# Patient Record
Sex: Female | Born: 1975 | Race: White | Hispanic: No | Marital: Married | Smoking: Former smoker
Health system: Southern US, Community
[De-identification: ages and names within clinical notes are randomized; demographics above are authoritative.]

## PROBLEM LIST (undated history)

## (undated) DIAGNOSIS — T7840XA Allergy, unspecified, initial encounter: Secondary | ICD-10-CM

## (undated) DIAGNOSIS — F410 Panic disorder [episodic paroxysmal anxiety] without agoraphobia: Secondary | ICD-10-CM

## (undated) DIAGNOSIS — Z87898 Personal history of other specified conditions: Secondary | ICD-10-CM

## (undated) DIAGNOSIS — G43909 Migraine, unspecified, not intractable, without status migrainosus: Secondary | ICD-10-CM

## (undated) DIAGNOSIS — N189 Chronic kidney disease, unspecified: Secondary | ICD-10-CM

## (undated) DIAGNOSIS — F32A Depression, unspecified: Secondary | ICD-10-CM

## (undated) DIAGNOSIS — E538 Deficiency of other specified B group vitamins: Secondary | ICD-10-CM

## (undated) DIAGNOSIS — R32 Unspecified urinary incontinence: Secondary | ICD-10-CM

## (undated) DIAGNOSIS — N200 Calculus of kidney: Secondary | ICD-10-CM

## (undated) DIAGNOSIS — F329 Major depressive disorder, single episode, unspecified: Secondary | ICD-10-CM

## (undated) DIAGNOSIS — F419 Anxiety disorder, unspecified: Secondary | ICD-10-CM

## (undated) DIAGNOSIS — G43109 Migraine with aura, not intractable, without status migrainosus: Secondary | ICD-10-CM

## (undated) HISTORY — DX: Deficiency of other specified B group vitamins: E53.8

## (undated) HISTORY — DX: Depression, unspecified: F32.A

## (undated) HISTORY — DX: Calculus of kidney: N20.0

## (undated) HISTORY — DX: Chronic kidney disease, unspecified: N18.9

## (undated) HISTORY — DX: Panic disorder (episodic paroxysmal anxiety): F41.0

## (undated) HISTORY — DX: Major depressive disorder, single episode, unspecified: F32.9

## (undated) HISTORY — DX: Unspecified urinary incontinence: R32

## (undated) HISTORY — DX: Allergy, unspecified, initial encounter: T78.40XA

## (undated) HISTORY — DX: Personal history of other specified conditions: Z87.898

## (undated) HISTORY — DX: Migraine with aura, not intractable, without status migrainosus: G43.109

## (undated) HISTORY — DX: Anxiety disorder, unspecified: F41.9

---
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
## 2008-06-25 ENCOUNTER — Inpatient Hospital Stay (HOSPITAL_COMMUNITY): Admission: AD | Admit: 2008-06-25 | Discharge: 2008-06-28 | Payer: Self-pay | Admitting: Obstetrics and Gynecology

## 2008-06-25 ENCOUNTER — Encounter (HOSPITAL_COMMUNITY): Payer: Self-pay | Admitting: Obstetrics and Gynecology

## 2010-09-20 ENCOUNTER — Encounter: Admission: RE | Admit: 2010-09-20 | Discharge: 2010-09-20 | Payer: Self-pay | Admitting: Family Medicine

## 2011-04-25 NOTE — Op Note (Signed)
Diamond Hodges, Diamond Hodges NO.:  [id number]   MEDICAL RECORD NO.:  [id number]          PATIENT TYPE:  INP   LOCATION:  9103                          FACILITY:  [facility]   PHYSICIAN:  Zelphia Cairo, MD    DATE OF BIRTH:  Oct 06, 1976   DATE OF PROCEDURE:  06/25/2008  DATE OF DISCHARGE:                               OPERATIVE REPORT   PREOPERATIVE DIAGNOSES:  1. Intrauterine pregnancy at 38 weeks.  2. Low placenta.  3. Vaginal bleeding.   POSTOPERATIVE DIAGNOSES:  1. Intrauterine pregnancy at 38 weeks.  2. Low placenta.  3. Vaginal bleeding.   PROCEDURE:  Primary low transverse cesarean delivery.   SURGEON:  Zelphia Cairo, MD   ANESTHESIA:  Spinal.   ESTIMATED BLOOD LOSS:  600 mL.   URINE OUTPUT:  175 mL of clear urine.   COMPLICATIONS:  None.   FINDINGS:  Vigorous female with Apgars of 8 and 9, normal appearing  pelvis.   COMPLICATIONS:  None.   CONDITION:  To recovery room.   PROCEDURE:  The patient was taken to the operating room where spinal  anesthesia was found to be adequate.  She was placed in the supine  position with a left tilt.  Fetal heart tones were reassuring between  150s and 160s.  She was prepped and draped in sterile fashion and a  Foley catheter was inserted.  Pfannenstiel skin incision was made with a  scalpel and carried down to the underlying fascia.  The fascia was  incised in the midline.  This was extended laterally with Mayo scissors.  Kocher clamps were used to grasp the superior portion of the fascia.  This was tented upwards.  The underlying rectus muscles were dissected  off using the Bovie.  Inferior portion of the fascia was tented upwards  and the underlying rectus muscles were dissected off using the Bovie.  Peritoneum was then identified and entered sharply with Metzenbaum  scissors.  This was extended superiorly and inferiorly with good  visualization of the bladder.  The bladder flap was then created and the  bladder blade was reinserted.   Uterine incision was made with a scalpel and this was extended bluntly.  The fetal vertex was brought to the uterine incision.  The mouth and  nose were suctioned and the shoulder and body easily followed.  The cord  was clamped and cut and the infant was taken to the awaiting pediatric  staff.  The placenta was manually removed from the uterus.  The uterus  was cleared of all clots and debris using a dry lap sponge.  Any  remaining membranes were teased out from the uterine cavity using a ring  forceps.  Dry lap sponges then reinserted to clean out any clots and  debris and remaining placental tissue.  The uterine incision was then  closed in a double layer fascia using 0 chromic in a running locked  fashion.  Once the uterine incision was found to be hemostatic, the  uterus was placed back into the pelvic cavity and the pelvis was  copiously irrigated with warm normal saline.  Uterine incision was  reinspected and found to be hemostatic.  Peritoneum was then closed with  0 Monocryl.  The fascia was closed with a looped 0 PDS and skin was  closed with staples.  Sponge lap, needle and instrument counts were  correct x2      Zelphia Cairo, MD  Electronically Signed     GA/MEDQ  D:  06/25/2008  T:  06/26/2008  Job:  [id number]

## 2011-04-28 NOTE — Discharge Summary (Signed)
Diamond Hodges, BLANKENBECKLER NO.:  [id number]   MEDICAL RECORD NO.:  [id number]          PATIENT TYPE:  INP   LOCATION:  9103                          FACILITY:  [facility]   PHYSICIAN:  Freddy Finner, M.D.   DATE OF BIRTH:  1976/03/23   DATE OF ADMISSION:  06/25/2008  DATE OF DISCHARGE:  06/28/2008                               DISCHARGE SUMMARY   ADMITTING DIAGNOSES:  1. Intrauterine pregnancy at 49 weeks' estimated gestational age.  2. History of low-lying placenta.  3. Vaginal bleeding.   DISCHARGE DIAGNOSES:  1. Status post low-transverse cesarean section.  2. Viable female infant.   PROCEDURE:  Primary low-transverse cesarean section.   REASON FOR ADMISSION:  Please see written H&P.   HOSPITAL COURSE:  The patient is a 36 year old gravida 3, para 1-0-1-1  that was admitted to [facility] at 35 weeks' estimated  gestational age for a cesarean delivery.  The patient had been followed  early in her pregnancy for a low-lying placenta that had been resolved  by her last ultrasound.  However, during the cervical exam while here in  the office, the patient did experience an episode of heavy vaginal  bleeding.  No contractions were noted and fetal heart tones were  reactive; however, the patient was now admitted for a cesarean delivery.  The patient was taken to the operating room, where spinal anesthesia was  administered without difficulty.  A low-transverse incision was made  with delivery of a viable female infant weighing 8 pounds 3 ounces with  Apgars of 8 at one minute and 9 at five minutes.  The patient tolerated  the procedure well and was taken to the recovery room in stable  condition.  On postoperative day #1, the patient was without complaint.  Vital signs were stable.  Abdomen soft with good return of bowel  function.  Fundus was firm and nontender.  Abdominal dressing noted to  be clean, dry, and intact.  Foley was draining with adequate  amounts of  urine output.  Laboratory findings revealed a hemoglobin of 9.0,  platelet count of 187,000, WBC of 17.7.  On postoperative day #2, the  patient was without complaint.  Vital signs were stable.  She was  afebrile.  Fundus firm and nontender.  Incision was clean, dry, and  intact.  On postoperative day #3, the patient was without complaint.  Vital signs were stable.  She was afebrile.  Fundus firm and nontender.  Incision was clean, dry, and intact.  Staples removed, and the patient  was later discharged home.   CONDITION ON DISCHARGE:  Stable.   DIET:  Regular as tolerated.   ACTIVITY:  No heavy lifting, no driving x2 weeks, and no vaginal entry.   FOLLOWUP:  The patient to follow up in the office in 1-2 weeks for an  incision check.  She is to call for temperature greater than 100  degrees, persistent nausea, vomiting, heavy vaginal bleeding, and/or  redness or drainage from the incisional site.   DISCHARGE MEDICATIONS:  Tylox #30 one p.o. every 4-6 hours p.r.n.,  Motrin  600 mg every 6 hours, prenatal vitamins one p.o. daily, Colace  one p.o. daily p.r.n.      Julio Sicks, N.P.      Freddy Finner, M.D.  Electronically Signed    CC/MEDQ  D:  07/26/2008  T:  07/27/2008  Job:  [id number]

## 2011-09-08 LAB — CBC
HCT: 26.8 — ABNORMAL LOW
HCT: 35.4 — ABNORMAL LOW
Hemoglobin: 11.9 — ABNORMAL LOW
Hemoglobin: 9 — ABNORMAL LOW
MCHC: 33.7
MCHC: 33.7
MCV: 88.4
MCV: 89.8
Platelets: 187
Platelets: 239
RBC: 2.98 — ABNORMAL LOW
RBC: 4
RDW: 15.7 — ABNORMAL HIGH
RDW: 15.7 — ABNORMAL HIGH
WBC: 15.7 — ABNORMAL HIGH
WBC: 17.7 — ABNORMAL HIGH

## 2011-09-08 LAB — RPR: RPR Ser Ql: NONREACTIVE

## 2011-09-08 LAB — TYPE AND SCREEN
ABO/RH(D): O POS
Antibody Screen: NEGATIVE

## 2011-09-08 LAB — ABO/RH: ABO/RH(D): O POS

## 2015-04-28 ENCOUNTER — Observation Stay (HOSPITAL_COMMUNITY)
Admission: EM | Admit: 2015-04-28 | Discharge: 2015-04-30 | Disposition: A | Discharge status: Home / Self Care | Payer: BC Managed Care – PPO | Attending: Urology | Admitting: Urology

## 2015-04-28 ENCOUNTER — Emergency Department (HOSPITAL_COMMUNITY): Payer: BC Managed Care – PPO

## 2015-04-28 ENCOUNTER — Encounter (HOSPITAL_COMMUNITY): Payer: Self-pay

## 2015-04-28 DIAGNOSIS — R109 Unspecified abdominal pain: Secondary | ICD-10-CM

## 2015-04-28 DIAGNOSIS — Z975 Presence of (intrauterine) contraceptive device: Secondary | ICD-10-CM | POA: Insufficient documentation

## 2015-04-28 DIAGNOSIS — R52 Pain, unspecified: Secondary | ICD-10-CM

## 2015-04-28 DIAGNOSIS — N200 Calculus of kidney: Secondary | ICD-10-CM | POA: Diagnosis present

## 2015-04-28 DIAGNOSIS — N390 Urinary tract infection, site not specified: Secondary | ICD-10-CM | POA: Diagnosis not present

## 2015-04-28 DIAGNOSIS — N201 Calculus of ureter: Principal | ICD-10-CM | POA: Diagnosis present

## 2015-04-28 DIAGNOSIS — R1110 Vomiting, unspecified: Secondary | ICD-10-CM

## 2015-04-28 LAB — CBC WITH DIFFERENTIAL/PLATELET
Basophils Absolute: 0 10*3/uL (ref 0.0–0.1)
Basophils Relative: 0 % (ref 0–1)
Eosinophils Absolute: 0.1 10*3/uL (ref 0.0–0.7)
Eosinophils Relative: 1 % (ref 0–5)
HCT: 37.7 % (ref 36.0–46.0)
Hemoglobin: 12.5 g/dL (ref 12.0–15.0)
Lymphocytes Relative: 10 % — ABNORMAL LOW (ref 12–46)
Lymphs Abs: 1 10*3/uL (ref 0.7–4.0)
MCH: 28.7 pg (ref 26.0–34.0)
MCHC: 33.2 g/dL (ref 30.0–36.0)
MCV: 86.7 fL (ref 78.0–100.0)
Monocytes Absolute: 0.6 10*3/uL (ref 0.1–1.0)
Monocytes Relative: 6 % (ref 3–12)
NEUTROS PCT: 83 % — AB (ref 43–77)
Neutro Abs: 8.7 10*3/uL — ABNORMAL HIGH (ref 1.7–7.7)
Platelets: 197 10*3/uL (ref 150–400)
RBC: 4.35 MIL/uL (ref 3.87–5.11)
RDW: 13.2 % (ref 11.5–15.5)
WBC: 10.4 10*3/uL (ref 4.0–10.5)

## 2015-04-28 LAB — URINALYSIS, ROUTINE W REFLEX MICROSCOPIC
Bilirubin Urine: NEGATIVE
Glucose, UA: NEGATIVE mg/dL
Ketones, ur: >80 mg/dL — AB
Nitrite: NEGATIVE
PH: 5 (ref 5.0–8.0)
Protein, ur: NEGATIVE mg/dL
Specific Gravity, Urine: 1.025 (ref 1.005–1.030)
Urobilinogen, UA: 0.2 mg/dL (ref 0.0–1.0)

## 2015-04-28 LAB — COMPREHENSIVE METABOLIC PANEL
ALT: 16 U/L (ref 14–54)
AST: 23 U/L (ref 15–41)
Albumin: 4.6 g/dL (ref 3.5–5.0)
Alkaline Phosphatase: 52 U/L (ref 38–126)
Anion gap: 11 (ref 5–15)
BUN: 13 mg/dL (ref 6–20)
CO2: 19 mmol/L — ABNORMAL LOW (ref 22–32)
Calcium: 9.2 mg/dL (ref 8.9–10.3)
Chloride: 105 mmol/L (ref 101–111)
Creatinine, Ser: 1.04 mg/dL — ABNORMAL HIGH (ref 0.44–1.00)
GFR calc Af Amer: >60 mL/min (ref >60)
GFR calc non Af Amer: >60 mL/min (ref >60)
Glucose, Bld: 132 mg/dL — ABNORMAL HIGH (ref 65–99)
POTASSIUM: 3.5 mmol/L (ref 3.5–5.1)
Sodium: 135 mmol/L (ref 135–145)
Total Bilirubin: 0.5 mg/dL (ref 0.3–1.2)
Total Protein: 7.4 g/dL (ref 6.5–8.1)

## 2015-04-28 LAB — LIPASE, BLOOD: Lipase: 16 U/L — ABNORMAL LOW (ref 22–51)

## 2015-04-28 LAB — POC URINE PREG, ED: Preg Test, Ur: NEGATIVE

## 2015-04-28 LAB — URINE MICROSCOPIC-ADD ON

## 2015-04-28 MED ORDER — SODIUM CHLORIDE 0.9 % IV BOLUS (SEPSIS)
1000.0000 mL | Freq: Once | INTRAVENOUS | Status: AC
  Administered 2015-04-28: 1000 mL via INTRAVENOUS

## 2015-04-28 MED ORDER — ENOXAPARIN SODIUM 40 MG/0.4ML ~~LOC~~ SOLN
40.0000 mg | SUBCUTANEOUS | Status: DC
  Administered 2015-04-28 – 2015-04-29 (×2): 40 mg via SUBCUTANEOUS
  Filled 2015-04-28 (×2): qty 0.4

## 2015-04-28 MED ORDER — ONDANSETRON HCL 4 MG/2ML IJ SOLN
4.0000 mg | Freq: Once | INTRAMUSCULAR | Status: AC
  Administered 2015-04-28: 4 mg via INTRAVENOUS
  Filled 2015-04-28: qty 2

## 2015-04-28 MED ORDER — KETOROLAC TROMETHAMINE 30 MG/ML IJ SOLN
30.0000 mg | Freq: Once | INTRAMUSCULAR | Status: AC
  Administered 2015-04-28: 30 mg via INTRAVENOUS
  Filled 2015-04-28: qty 1

## 2015-04-28 MED ORDER — KETOROLAC TROMETHAMINE 30 MG/ML IJ SOLN
30.0000 mg | Freq: Four times a day (QID) | INTRAMUSCULAR | Status: DC
  Administered 2015-04-28 – 2015-04-30 (×7): 30 mg via INTRAVENOUS
  Filled 2015-04-28 (×12): qty 1

## 2015-04-28 MED ORDER — MORPHINE SULFATE 4 MG/ML IJ SOLN
4.0000 mg | Freq: Once | INTRAMUSCULAR | Status: AC
  Administered 2015-04-28: 4 mg via INTRAVENOUS
  Filled 2015-04-28: qty 1

## 2015-04-28 MED ORDER — ZOLPIDEM TARTRATE 5 MG PO TABS
5.0000 mg | ORAL_TABLET | Freq: Every evening | ORAL | Status: DC | PRN
  Filled 2015-04-28: qty 1

## 2015-04-28 MED ORDER — HYDROMORPHONE HCL 1 MG/ML IJ SOLN
1.0000 mg | INTRAMUSCULAR | Status: DC | PRN
  Administered 2015-04-28: 1 mg via INTRAVENOUS
  Filled 2015-04-28 (×2): qty 1

## 2015-04-28 MED ORDER — ACETAMINOPHEN 325 MG PO TABS: 650.0000 mg | ORAL_TABLET | ORAL | Status: DC | PRN

## 2015-04-28 MED ORDER — DOCUSATE SODIUM 100 MG PO CAPS
100.0000 mg | ORAL_CAPSULE | Freq: Two times a day (BID) | ORAL | Status: DC
  Administered 2015-04-28 – 2015-04-30 (×4): 100 mg via ORAL
  Filled 2015-04-28 (×6): qty 1

## 2015-04-28 MED ORDER — ONDANSETRON HCL 4 MG/2ML IJ SOLN
4.0000 mg | INTRAMUSCULAR | Status: DC | PRN
  Administered 2015-04-28: 4 mg via INTRAVENOUS
  Filled 2015-04-28: qty 2

## 2015-04-28 MED ORDER — TAMSULOSIN HCL 0.4 MG PO CAPS
0.4000 mg | ORAL_CAPSULE | Freq: Every day | ORAL | Status: DC
  Administered 2015-04-28 – 2015-04-29 (×2): 0.4 mg via ORAL
  Filled 2015-04-28 (×2): qty 1

## 2015-04-28 MED ORDER — MORPHINE SULFATE 4 MG/ML IJ SOLN
8.0000 mg | Freq: Once | INTRAMUSCULAR | Status: AC
  Administered 2015-04-28: 8 mg via INTRAVENOUS
  Filled 2015-04-28: qty 2

## 2015-04-28 MED ORDER — OXYCODONE HCL 5 MG PO TABS
5.0000 mg | ORAL_TABLET | ORAL | Status: DC | PRN
  Administered 2015-04-29 (×2): 5 mg via ORAL
  Filled 2015-04-28 (×2): qty 1

## 2015-04-28 MED ORDER — DEXTROSE-NACL 5-0.45 % IV SOLN
INTRAVENOUS | Status: DC
  Administered 2015-04-28 – 2015-04-30 (×4): via INTRAVENOUS

## 2015-04-28 NOTE — ED Provider Notes (Signed)
CSN: [id number]     Arrival date & time 04/28/15  0710 History   First MD Initiated Contact with Patient 04/28/15 [phone]     Chief Complaint  Patient presents with  . Flank Pain     (Consider location/radiation/quality/duration/timing/severity/associated sxs/prior Treatment) HPI Comments: Diamond Hodges is a 39 y.o. female with a PMHx of pyelonephritis and frequent UTIs, who presents to the ED with complaints of sudden onset left flank pain that began around 3 AM. She states that yesterday she went her gynecologists office because she was having suprapubic pressure and hematuria, and she was diagnosed with a UTI and started on Macrobid. She states that the symptoms have improved today although there still present. Her left flank pain is described as 7/10 constant sharp and aching waxing and waning in severity, nonradiating, worse with standing, mildly improved with a hot bath, and unrelieved with Aleve. She endorses associated nausea and urinary hesitancy describing that she has to "force the urine out" when she uses the restroom. She is unsure if she still having hematuria because she just finished her menses and can't determine whether the bleeding is vaginal blood or urinary blood. Denies any fevers, chills, chest pain, shortness of breath, vomiting, diarrhea, constipation, obstipation, melena, hematochezia, urinary urgency or frequency, rectal pain, vaginal discharge, myalgias, arthralgias, rashes, numbness, tingling, weakness, cauda equina symptoms. She is sexually active with one female partner, her husband, unprotected although she has an IUD in place. She is just finishing her menses.  Patient is a 39 y.o. female presenting with flank pain. The history is provided by the patient. No language interpreter was used.  Flank Pain This is a new problem. The current episode started today. The problem occurs constantly. The problem has been waxing and waning. Associated symptoms include nausea. Pertinent  negatives include no abdominal pain, arthralgias, chest pain, chills, fever, myalgias, numbness, rash, vomiting or weakness. The symptoms are aggravated by standing. She has tried NSAIDs and heat for the symptoms. The treatment provided mild relief.    History reviewed. No pertinent past medical history. Past Surgical History  Procedure Laterality Date  . Cesarean section     No family history on file. History  Substance Use Topics  . Smoking status: Never Smoker   . Smokeless tobacco: Not on file  . Alcohol Use: No   OB History    No data available     Review of Systems  Constitutional: Negative for fever and chills.  Respiratory: Negative for shortness of breath.   Cardiovascular: Negative for chest pain.  Gastrointestinal: Positive for nausea. Negative for vomiting, abdominal pain, diarrhea and constipation.  Genitourinary: Positive for dysuria (pressure, feels like she has to force the urine out) and flank pain. Negative for urgency, frequency, hematuria, vaginal bleeding, vaginal discharge and vaginal pain.  Musculoskeletal: Negative for myalgias and arthralgias.  Skin: Negative for rash.  Allergic/Immunologic: Negative for immunocompromised state.  Neurological: Negative for weakness and numbness.  Psychiatric/Behavioral: Negative for confusion.   10 Systems reviewed and are negative for acute change except as noted in the HPI.    Allergies  Review of patient's allergies indicates no known allergies.  Home Medications   Prior to Admission medications   Not on File   BP 126/84 mmHg  Pulse 78  Temp(Src) 98.4 F (36.9 C) (Oral)  Resp 18  SpO2 100%  LMP 04/26/2015 (Approximate) Physical Exam  Constitutional: She is oriented to person, place, and time. Vital signs are normal. She appears well-developed and well-nourished.  Non-toxic appearance. No distress.  Afebrile, nontoxic, NAD  HENT:  Head: Normocephalic and atraumatic.  Mouth/Throat: Oropharynx is clear  and moist and mucous membranes are normal.  Eyes: Conjunctivae and EOM are normal. Right eye exhibits no discharge. Left eye exhibits no discharge.  Neck: Normal range of motion. Neck supple.  Cardiovascular: Normal rate, regular rhythm, normal heart sounds and intact distal pulses.  Exam reveals no gallop and no friction rub.   No murmur heard. Pulmonary/Chest: Effort normal and breath sounds normal. No respiratory distress. She has no decreased breath sounds. She has no wheezes. She has no rhonchi. She has no rales.  Abdominal: Soft. Normal appearance and bowel sounds are normal. She exhibits no distension. There is no tenderness. There is CVA tenderness (L sided). There is no rigidity, no rebound, no guarding, no tenderness at McBurney's point and negative Murphy's sign.  Soft, NTND, +BS throughout, no r/g/r, neg murphy's, neg mcburney's, with L sided CVA TTP   Musculoskeletal: Normal range of motion.  MAE x4 Strength and sensation grossly intact Distal pulses intact No midline spinal TTP or bony step offs  Neurological: She is alert and oriented to person, place, and time. She has normal strength. No sensory deficit.  Skin: Skin is warm, dry and intact. No rash noted.  Psychiatric: She has a normal mood and affect.  Nursing note and vitals reviewed.   ED Course  Procedures (including critical care time) Labs Review Labs Reviewed  CBC WITH DIFFERENTIAL/PLATELET - Abnormal; Notable for the following:    Neutrophils Relative % 83 (*)    Neutro Abs 8.7 (*)    Lymphocytes Relative 10 (*)    All other components within normal limits  COMPREHENSIVE METABOLIC PANEL - Abnormal; Notable for the following:    CO2 19 (*)    Glucose, Bld 132 (*)    Creatinine, Ser 1.04 (*)    All other components within normal limits  LIPASE, BLOOD - Abnormal; Notable for the following:    Lipase 16 (*)    All other components within normal limits  URINALYSIS, ROUTINE W REFLEX MICROSCOPIC - Abnormal;  Notable for the following:    APPearance CLOUDY (*)    Hgb urine dipstick LARGE (*)    Ketones, ur >80 (*)    Leukocytes, UA SMALL (*)    All other components within normal limits  URINE MICROSCOPIC-ADD ON  POC URINE PREG, ED    Imaging Review Ct Renal Stone Study  04/28/2015   CLINICAL DATA:  Left flank pain  EXAM: CT ABDOMEN AND PELVIS WITHOUT CONTRAST  TECHNIQUE: Multidetector CT imaging of the abdomen and pelvis was performed following the standard protocol without IV contrast.  COMPARISON:  None.  FINDINGS: Lung bases are unremarkable. Sagittal images of the spine are unremarkable.  Unenhanced liver shows no biliary ductal dilatation. No calcified gallstones are noted within gallbladder. No aortic aneurysm.  Unenhanced pancreas, spleen and adrenal glands are unremarkable. There is mild left hydronephrosis and left hydroureter.  Significant left perinephric stranding and small amount of left perinephric fluid. There is left periureteral stranding. In axial image 71 there is a calcified obstructive calculus in distal left ureter measures 4 mm about 4.5 cm from right UVJ. There is IUD in place within retroflexed uterus. No adnexal mass. Urinary bladder is under distended. Bilateral distal ureter is unremarkable.  There is calcified nonobstructive calculus in lower pole of the right kidney measures 6 mm.  Moderate stool and gas is noted in right colon. There is  no pericecal inflammation. Normal retrocecal appendix partially visualized in axial image 70. The terminal ileum is unremarkable.  No small bowel obstruction.  No ascites or free air.  IMPRESSION: 1. There is mild left hydronephrosis and left hydroureter. Significant left perinephric stranding. Mild left periureteral stranding and fluid. Axial image 71 there is 4 mm calcified obstructive calculus in distal left ureter about 4.5 cm from left UVJ. 2. There is nonobstructive calcified calculus in lower pole of the right kidney measures 6 mm. 3. No  pericecal inflammation.  Normal appendix. 4. No small bowel obstruction.   Electronically Signed   By: Natasha MeadLiviu  Pop M.D.   On: 04/28/2015 10:32     EKG Interpretation None      MDM   Final diagnoses:  Left flank pain  Nephrolithiasis  Intractable pain  Intractable vomiting with nausea, vomiting of unspecified type    39 y.o. female here with sudden onset L flank pain, coming in waves, with nausea. On exam, no abdominal tenderness, but L flank CVA TTP. Afebrile. [facility], states she had some suprapubic pressure (denies pain) and hematuria and was started on Macrobid. This seems to be improved today but the flank pain started. Has a hx of pyelo in the past. No vaginal/pelvic symptoms, and no lower abd tenderness, doubt need for pelvic at this time, will proceed with labs and CT stone study. Will give fluids, antiemetics, and pain control.  10:53 AM Upreg neg. U/A with ketones and small leuks, rare squamous, 3-6 WBC, 21-50 RBC, and rare bacteria which is less consistent with her diagnosis of UTI yesterday. CBC w/diff unremarkable, less concerned for pyelo given no leukocytosis. CMP WNL although Cr with slight bump at 1.04. Lipase WNL. CT study showing L hydronephrosis and hydroureter with perinephric stranding and 4mm obstructive stone in distal ureter, as well as nonobstructive R renal stone. Pain somewhat improved but still present after total of 12mg  morphine. Nausea improved with zofran. Will consult urology to discuss.  11:59 AM Dr. Retta Dionesahlstedt of urology returning page, advises to try toradol for improved pain control, if unrelieved then call him back and he will consider intervention, but if able to control pain then can f/up closely with his office. Will attempt this now. Will reassess shortly.   1:10 PM Pt moved and had worsening pain and nausea returned. Called Dr. Retta Dionesahlstedt back and he states he's in the middle of clinic but will come see  pt as soon as he can, will proceed with beginning admission/bed requests and monitor pt. Will order more zofran. Pt states pain is 4/10 now, but doesn't want anything else right now, will continue to monitor.   1:55 PM Peyton NajjarLarry, NP for urology, here to see pt, will admit pt for supportive measures and monitoring. Please see their notes for further documentation of care.   [address]Diamond Emile Yates Centeramprubi-Soms, PA-C 04/28/15 1355  Gerhard Munchobert Lockwood, MD 04/28/15 1537

## 2015-04-28 NOTE — Progress Notes (Addendum)
[facility] [place] providers EPIC updated  Pt voiced interest in educational material on kidneys/uteral stones and dietary measures CM provided pt with information from d/c EPIC section to review

## 2015-04-28 NOTE — ED Notes (Signed)
Pt presents with c/o left flank pain that started around 3 this morning. Pt reports she is feeling spasms in that left flank area as well. Pt went to her gynecologist yesterday and was diagnosed with a bladder infection. Pt reports some blood in her urine but she also reports that she just finished her menstrual cycle. Pt denies any urgency or frequency at this time.

## 2015-04-28 NOTE — H&P (Signed)
H&P  Chief Complaint: Left flank pain  History of Present Illness: Diamond Hodges is a 39 y.o. year old female [facility] with intermittent severe left sided lower back and flank pain. Her symptoms were also associated with urinary frequency/urgency, hesitancy, and weakened stream. She denies gross hematuria or fever. Of note: she was seen by her OBGYN earlier yesterday for pelvic pain and associated urinary frequency/dysuria. She was diagnosed with a UTI and prescribed an antibiotic.   She has never had a urological evaluation and denies history of kidney stones. Currently she is resting comfortably after receiving morphine, torodal and anti-emetics. She does endorse a sensation of bladder pressure which is a new finding. History reviewed. No pertinent past medical history.  Past Surgical History  Procedure Laterality Date  . Cesarean section      Home Medications:   (Not in a hospital admission)  Allergies: No Known Allergies  No family history on file.  Social History:  reports that she has never smoked. She does not have any smokeless tobacco history on file. She reports that she does not drink alcohol or use illicit drugs.  ROS: A complete review of systems was performed.  All systems are negative except for pertinent findings as noted.  Physical Exam:  Vital signs in last 24 hours: Temp:  [98.1 F (36.7 C)-98.4 F (36.9 C)] 98.1 F (36.7 C) (05/18 1220) Pulse Rate:  [78-96] 96 (05/18 1352) Resp:  [17-18] 18 (05/18 1352) BP: (112-126)/(62-84) 112/68 mmHg (05/18 1352) SpO2:  [96 %-100 %] 97 % (05/18 1352) General:  Alert and oriented, No acute distress HEENT: Normocephalic, atraumatic Neck: No JVD or lymphadenopathy Cardiovascular: Regular rate and rhythm Lungs: Clear bilaterally Abdomen: Soft, nontender, nondistended, no abdominal masses Back: No CVA tenderness Extremities: No edema Neurologic: Grossly intact  Laboratory Data:  Results  for orders placed or performed during the hospital encounter of 04/28/15 (from the past 24 hour(s))  CBC with Differential     Status: Abnormal   Collection Time: 04/28/15  8:15 AM  Result Value Ref Range   WBC 10.4 4.0 - 10.5 K/uL   RBC 4.35 3.87 - 5.11 MIL/uL   Hemoglobin 12.5 12.0 - 15.0 g/dL   HCT 16.137.7 09.636.0 - 04.546.0 %   MCV 86.7 78.0 - 100.0 fL   MCH 28.7 26.0 - 34.0 pg   MCHC 33.2 30.0 - 36.0 g/dL   RDW 40.913.2 81.111.5 - 91.415.5 %   Platelets 197 150 - 400 K/uL   Neutrophils Relative % 83 (H) 43 - 77 %   Neutro Abs 8.7 (H) 1.7 - 7.7 K/uL   Lymphocytes Relative 10 (L) 12 - 46 %   Lymphs Abs 1.0 0.7 - 4.0 K/uL   Monocytes Relative 6 3 - 12 %   Monocytes Absolute 0.6 0.1 - 1.0 K/uL   Eosinophils Relative 1 0 - 5 %   Eosinophils Absolute 0.1 0.0 - 0.7 K/uL   Basophils Relative 0 0 - 1 %   Basophils Absolute 0.0 0.0 - 0.1 K/uL  Comprehensive metabolic panel     Status: Abnormal   Collection Time: 04/28/15  8:15 AM  Result Value Ref Range   Sodium 135 135 - 145 mmol/L   Potassium 3.5 3.5 - 5.1 mmol/L   Chloride 105 101 - 111 mmol/L   CO2 19 (L) 22 - 32 mmol/L   Glucose, Bld 132 (H) 65 - 99 mg/dL   BUN 13 6 - 20 mg/dL   Creatinine,  Ser 1.04 (H) 0.44 - 1.00 mg/dL   Calcium 9.2 8.9 - 16.1 mg/dL   Total Protein 7.4 6.5 - 8.1 g/dL   Albumin 4.6 3.5 - 5.0 g/dL   AST 23 15 - 41 U/L   ALT 16 14 - 54 U/L   Alkaline Phosphatase 52 38 - 126 U/L   Total Bilirubin 0.5 0.3 - 1.2 mg/dL   GFR calc non Af Amer >60 >60 mL/min   GFR calc Af Amer >60 >60 mL/min   Anion gap 11 5 - 15  Lipase, blood     Status: Abnormal   Collection Time: 04/28/15  8:15 AM  Result Value Ref Range   Lipase 16 (L) 22 - 51 U/L  Urinalysis, Routine w reflex microscopic     Status: Abnormal   Collection Time: 04/28/15  9:30 AM  Result Value Ref Range   Color, Urine YELLOW YELLOW   APPearance CLOUDY (A) CLEAR   Specific Gravity, Urine 1.025 1.005 - 1.030   pH 5.0 5.0 - 8.0   Glucose, UA NEGATIVE NEGATIVE mg/dL   Hgb  urine dipstick LARGE (A) NEGATIVE   Bilirubin Urine NEGATIVE NEGATIVE   Ketones, ur >80 (A) NEGATIVE mg/dL   Protein, ur NEGATIVE NEGATIVE mg/dL   Urobilinogen, UA 0.2 0.0 - 1.0 mg/dL   Nitrite NEGATIVE NEGATIVE   Leukocytes, UA SMALL (A) NEGATIVE  Urine microscopic-add on     Status: None   Collection Time: 04/28/15  9:30 AM  Result Value Ref Range   Squamous Epithelial / LPF RARE RARE   WBC, UA 3-6 <3 WBC/hpf   RBC / HPF 21-50 <3 RBC/hpf   Bacteria, UA RARE RARE  POC urine preg, ED (not at [facility])     Status: None   Collection Time: 04/28/15  9:50 AM  Result Value Ref Range   Preg Test, Ur NEGATIVE NEGATIVE   No results found for this or any previous visit (from the past 240 hour(s)). Creatinine:  Recent Labs  04/28/15 0815  CREATININE 1.04*    Radiologic Imaging: Ct Renal Stone Study  04/28/2015   CLINICAL DATA:  Left flank pain  EXAM: CT ABDOMEN AND PELVIS WITHOUT CONTRAST  TECHNIQUE: Multidetector CT imaging of the abdomen and pelvis was performed following the standard protocol without IV contrast.  COMPARISON:  None.  FINDINGS: Lung bases are unremarkable. Sagittal images of the spine are unremarkable.  Unenhanced liver shows no biliary ductal dilatation. No calcified gallstones are noted within gallbladder. No aortic aneurysm.  Unenhanced pancreas, spleen and adrenal glands are unremarkable. There is mild left hydronephrosis and left hydroureter.  Significant left perinephric stranding and small amount of left perinephric fluid. There is left periureteral stranding. In axial image 71 there is a calcified obstructive calculus in distal left ureter measures 4 mm about 4.5 cm from right UVJ. There is IUD in place within retroflexed uterus. No adnexal mass. Urinary bladder is under distended. Bilateral distal ureter is unremarkable.  There is calcified nonobstructive calculus in lower pole of the right kidney measures 6 mm.  Moderate stool and gas is noted in right colon. There is no  pericecal inflammation. Normal retrocecal appendix partially visualized in axial image 70. The terminal ileum is unremarkable.  No small bowel obstruction.  No ascites or free air.  IMPRESSION: 1. There is mild left hydronephrosis and left hydroureter. Significant left perinephric stranding. Mild left periureteral stranding and fluid. Axial image 71 there is 4 mm calcified obstructive calculus in distal left ureter about 4.5  cm from left UVJ. 2. There is nonobstructive calcified calculus in lower pole of the right kidney measures 6 mm. 3. No pericecal inflammation.  Normal appendix. 4. No small bowel obstruction.   Electronically Signed   By: Natasha MeadLiviu  Pop M.D.   On: 04/28/2015 10:32    Impression/Assessment:  1. Left distal ureteral lithiasis with hydronephrosis seen on CT imaging 2. Right lower pole lithiasis; non-obstructing  Plan:  1. Admit to Urology service for overnight observation with supportive measures. 2. Begin straining urine 3. If patient remains comfortable with no change in symptoms ie fever, retention, gross hematuria; she will likely be d/c'd tomorrow morning and can follow up in our office at the first of next week.  Chelsea AusDAHLSTEDT, Vishal Sandlin M 04/28/2015, 2:02 PM  Bertram MillardStephen M. Selita Staiger MD

## 2015-04-29 ENCOUNTER — Observation Stay (HOSPITAL_COMMUNITY): Payer: BC Managed Care – PPO | Admitting: Anesthesiology

## 2015-04-29 ENCOUNTER — Encounter (HOSPITAL_COMMUNITY)
Admission: EM | Disposition: A | Discharge status: Home / Self Care | Payer: Self-pay | Source: Home / Self Care | Attending: Emergency Medicine

## 2015-04-29 ENCOUNTER — Encounter (HOSPITAL_COMMUNITY): Payer: Self-pay | Admitting: Anesthesiology

## 2015-04-29 HISTORY — PX: CYSTOSCOPY W/ URETERAL STENT PLACEMENT: SHX1429

## 2015-04-29 LAB — SURGICAL PCR SCREEN
MRSA, PCR: NEGATIVE
STAPHYLOCOCCUS AUREUS: POSITIVE — AB

## 2015-04-29 SURGERY — CYSTOSCOPY, WITH RETROGRADE PYELOGRAM AND URETERAL STENT INSERTION
Anesthesia: General | Site: Ureter | Laterality: Left

## 2015-04-29 MED ORDER — PROPOFOL 10 MG/ML IV BOLUS
INTRAVENOUS | Status: DC | PRN
  Administered 2015-04-29: 200 mg via INTRAVENOUS

## 2015-04-29 MED ORDER — OXYBUTYNIN CHLORIDE 5 MG PO TABS: 5.0000 mg | ORAL_TABLET | Freq: Three times a day (TID) | ORAL | Status: DC | PRN

## 2015-04-29 MED ORDER — LACTATED RINGERS IV SOLN
INTRAVENOUS | Status: DC
  Administered 2015-04-29: 18:00:00 via INTRAVENOUS
  Administered 2015-04-29: 1000 mL via INTRAVENOUS

## 2015-04-29 MED ORDER — CEFAZOLIN SODIUM 1 G IJ SOLR: 2.0000 g | Freq: Once | INTRAMUSCULAR | Status: DC

## 2015-04-29 MED ORDER — FENTANYL CITRATE (PF) 100 MCG/2ML IJ SOLN
INTRAMUSCULAR | Status: DC | PRN
  Administered 2015-04-29: 50 ug via INTRAVENOUS

## 2015-04-29 MED ORDER — LIDOCAINE HCL (CARDIAC) 20 MG/ML IV SOLN
INTRAVENOUS | Status: DC | PRN
  Administered 2015-04-29: 100 mg via INTRAVENOUS

## 2015-04-29 MED ORDER — ONDANSETRON HCL 4 MG/2ML IJ SOLN
INTRAMUSCULAR | Status: DC | PRN
  Administered 2015-04-29: 4 mg via INTRAVENOUS

## 2015-04-29 MED ORDER — CEFAZOLIN SODIUM-DEXTROSE 2-3 GM-% IV SOLR
INTRAVENOUS | Status: AC
  Filled 2015-04-29: qty 50

## 2015-04-29 MED ORDER — SODIUM CHLORIDE 0.9 % IR SOLN
Status: DC | PRN
  Administered 2015-04-29: 1000 mL
  Administered 2015-04-29: 3000 mL

## 2015-04-29 MED ORDER — OXYCODONE HCL 5 MG PO TABS: 5.0000 mg | ORAL_TABLET | ORAL | Status: DC | PRN

## 2015-04-29 MED ORDER — PROMETHAZINE HCL 25 MG/ML IJ SOLN: 6.2500 mg | INTRAMUSCULAR | Status: DC | PRN

## 2015-04-29 MED ORDER — CEPHALEXIN 500 MG PO CAPS: 500.0000 mg | ORAL_CAPSULE | Freq: Two times a day (BID) | ORAL | Status: DC

## 2015-04-29 MED ORDER — MIDAZOLAM HCL 2 MG/2ML IJ SOLN
INTRAMUSCULAR | Status: AC
  Filled 2015-04-29: qty 2

## 2015-04-29 MED ORDER — HYDROMORPHONE HCL 1 MG/ML IJ SOLN
INTRAMUSCULAR | Status: AC
  Filled 2015-04-29: qty 1

## 2015-04-29 MED ORDER — ACETAMINOPHEN 10 MG/ML IV SOLN
1000.0000 mg | Freq: Once | INTRAVENOUS | Status: AC
  Administered 2015-04-29: 1000 mg via INTRAVENOUS
  Filled 2015-04-29: qty 100

## 2015-04-29 MED ORDER — CEFAZOLIN SODIUM-DEXTROSE 2-3 GM-% IV SOLR
INTRAVENOUS | Status: DC | PRN
  Administered 2015-04-29: 2 g via INTRAVENOUS

## 2015-04-29 MED ORDER — FENTANYL CITRATE (PF) 100 MCG/2ML IJ SOLN
INTRAMUSCULAR | Status: AC
  Filled 2015-04-29: qty 2

## 2015-04-29 MED ORDER — CEFAZOLIN SODIUM-DEXTROSE 2-3 GM-% IV SOLR
2.0000 g | INTRAVENOUS | Status: DC
  Filled 2015-04-29: qty 50

## 2015-04-29 MED ORDER — MIDAZOLAM HCL 5 MG/5ML IJ SOLN
INTRAMUSCULAR | Status: DC | PRN
  Administered 2015-04-29: 2 mg via INTRAVENOUS

## 2015-04-29 MED ORDER — DEXAMETHASONE SODIUM PHOSPHATE 10 MG/ML IJ SOLN
INTRAMUSCULAR | Status: DC | PRN
  Administered 2015-04-29: 10 mg via INTRAVENOUS

## 2015-04-29 MED ORDER — HYDROMORPHONE HCL 1 MG/ML IJ SOLN
0.2500 mg | INTRAMUSCULAR | Status: DC | PRN
  Administered 2015-04-29 (×2): 0.5 mg via INTRAVENOUS

## 2015-04-29 MED ORDER — HYDROCODONE-ACETAMINOPHEN 7.5-325 MG PO TABS: 1.0000 | ORAL_TABLET | Freq: Once | ORAL | Status: DC | PRN

## 2015-04-29 MED ORDER — IOHEXOL 300 MG/ML  SOLN
INTRAMUSCULAR | Status: DC | PRN
  Administered 2015-04-29: 10 mL

## 2015-04-29 SURGICAL SUPPLY — 24 items
BAG URO CATCHER STRL LF (DRAPE) ×4 IMPLANT
BASKET ZERO TIP NITINOL 2.4FR (BASKET) ×4 IMPLANT
CATH URET 5FR 28IN CONE TIP (BALLOONS) ×2
CATH URET 5FR 70CM CONE TIP (BALLOONS) ×2 IMPLANT
CLOTH BEACON ORANGE TIMEOUT ST (SAFETY) ×4 IMPLANT
FIBER LASER FLEXIVA 1000 (UROLOGICAL SUPPLIES) IMPLANT
FIBER LASER FLEXIVA 200 (UROLOGICAL SUPPLIES) IMPLANT
FIBER LASER FLEXIVA 365 (UROLOGICAL SUPPLIES) IMPLANT
FIBER LASER FLEXIVA 550 (UROLOGICAL SUPPLIES) IMPLANT
FIBER LASER TRAC TIP (UROLOGICAL SUPPLIES) IMPLANT
GLOVE BIOGEL M 8.0 STRL (GLOVE) ×4 IMPLANT
GOWN STRL REUS W/ TWL XL LVL3 (GOWN DISPOSABLE) ×2 IMPLANT
GOWN STRL REUS W/TWL XL LVL3 (GOWN DISPOSABLE) ×6 IMPLANT
GUIDEWIRE STR DUAL SENSOR (WIRE) ×8 IMPLANT
KIT BALLIN UROMAX 15FX10 (LABEL) ×2 IMPLANT
MANIFOLD NEPTUNE II (INSTRUMENTS) ×4 IMPLANT
PACK CYSTO (CUSTOM PROCEDURE TRAY) ×4 IMPLANT
SET HIGH PRES BAL DIL (LABEL) ×2
SHEATH ACCESS URETERAL 24CM (SHEATH) ×4 IMPLANT
SHEATH ACCESS URETERAL 38CM (SHEATH) ×4 IMPLANT
STENT URET 6FRX24 CONTOUR (STENTS) ×4 IMPLANT
TUBING CONNECTING 10 (TUBING) ×3 IMPLANT
TUBING CONNECTING 10' (TUBING) ×1
WIRE COONS/BENSON .038X145CM (WIRE) ×4 IMPLANT

## 2015-04-29 NOTE — Anesthesia Preprocedure Evaluation (Addendum)
Anesthesia Evaluation  Patient identified by MRN, date of birth, ID band Patient awake    Reviewed: Allergy & Precautions, NPO status , Patient's Chart, lab work & pertinent test results  Airway Mallampati: II  TM Distance: >3 FB Neck ROM: Full    Dental   Pulmonary neg pulmonary ROS,  breath sounds clear to auscultation        Cardiovascular negative cardio ROS  Rhythm:Regular Rate:Normal     Neuro/Psych negative neurological ROS     GI/Hepatic negative GI ROS, Neg liver ROS,   Endo/Other  negative endocrine ROS  Renal/GU Left ureteral stone     Musculoskeletal   Abdominal   Peds  Hematology negative hematology ROS (+)   Anesthesia Other Findings   Reproductive/Obstetrics UPT negative                            Lab Results  Component Value Date   WBC 10.4 04/28/2015   HGB 12.5 04/28/2015   HCT 37.7 04/28/2015   MCV 86.7 04/28/2015   PLT 197 04/28/2015   Lab Results  Component Value Date   CREATININE 1.04* 04/28/2015   BUN 13 04/28/2015   NA 135 04/28/2015   K 3.5 04/28/2015   CL 105 04/28/2015   CO2 19* 04/28/2015    Anesthesia Physical Anesthesia Plan  ASA: II  Anesthesia Plan: General   Post-op Pain Management:    Induction: Intravenous  Airway Management Planned: LMA  Additional Equipment:   Intra-op Plan:   Post-operative Plan:   Informed Consent: I have reviewed the patients History and Physical, chart, labs and discussed the procedure including the risks, benefits and alternatives for the proposed anesthesia with the patient or authorized representative who has indicated his/her understanding and acceptance.   Dental advisory given  Plan Discussed with: CRNA  Anesthesia Plan Comments:         Anesthesia Quick Evaluation

## 2015-04-29 NOTE — Op Note (Signed)
Preoperative diagnosis:left ureteral calculus  Postoperative diagnosis:same   Procedure:cystoscopy, left retrograde ureteropyelogram with interpretive fluoroscopy, left ureteroscopy following dilatation of left distal ureter, basket extraction of left ureteral stone, placement of double-J stent and left ureter-6 [place] by 24 cm contour stent without tether    Surgeon: Lillette Boxer. Zakary Kimura, M.D.   Anesthesia: Gen.   Complications:none  Specimen(s):kidney stone, to the patient  Drain(s):6 [place] by 24 cm contour double-J stent with out string  Indications:39 year old female with intractable pain, nausea and vomiting from a 4 mm left mid/distal ureteral stone with minimal to moderate hydronephrosis. The patient has been hospitalized for proximally 24 hours, and still has nausea with intermittent pain. She is fairly intolerant of oral and IV pain medicines. At this point, because of the nonprogression of the stone as well as the patient's symptoms, she presents for ureteroscopic management. We have also discussed lithotripsy as an alternative  Treatment. Because of the distal location of the stone, ureteroscopy is recommended. Risks and complications have been discussed. She understands these and desires to proceed.    Technique and findings:the patient was properly identified and marked in the holding area. She was taken to the operating room where general endotracheal anesthetic was administered. She was placed in the dorsolithotomy position. Genitalia and perineum were prepped and draped. Proper timeout was performed.  A 23 French panendoscope was advanced into her bladder. Bladder was inspected circumferentially. When this metaplasia noted in the trigone. Otherwise, no abnormalities were noted. Ureteral orifices were normal in configuration and location. The left ureteral orifice was cannulated with a 6 [place] open-ended catheter. Gentle retrograde pyelogram with Omnipaque was performed.  This  revealed a normal distal ureter for proximally 4-5 cm. Just at that point there was a filling defect consistent with the previously mentioned stone. There was proximal hydroureteronephrosis. Mild extravasation of contrast and a forniceal pattern within the left kidney.  At this point, a 0.038 inch sensor-tip guidewire was advanced through the open-ended catheter, into the left renal pelvis using fluoroscopic guidance. The cystoscope and the open-ended catheter were removed, with the wire being left in place. I then dilated the distal ureter with the 12/14 ureteral access sheath, just using the core at first, and then the entire sheath at that point. Would only dilate for proximally 4 cm up the ureter. I then removed the access sheath and passed the ureteroscope first  Through the urethra, then up into the distal ureter. 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 I was unable to pass this gently with the scope. The scope was then backed out. I then passed a 10 cm, 15 French balloon dilator up the ureter. The dilator/balloon was filled with contrast/saline solution to a pressure of 20 atm it was evident that there was a very dense stricture about 4 cm up. Despite me leaving the balloon inflated for about 2 minutes, and up pressure of 20 atm, it still would not dilate. I then deflated the balloon, removed the balloon dilator, and gently passed the ureteroscope past the stricture, using another guidewire through the scope as a guide. I was able to negotiate the stricture. I then encountered the stone about 4 cm proximal to this. I then backed the ureteroscope back out, passed the balloon dilator up again, and once again dilated the entire distal ureter, including the strictured area, with the balloon dilator at a pressure 20 atm for 2 minutes. The balloon dilator was then removed, and the 2 guidewires were left in  place. I then repassed the ureteroscope up to the stone which was easily  grasped with the Nitinol basket and quite easily extracted through the ureter. It was saved for specimen. I then replaced the ureteroscope, and navigated the entire ureter. No further stones were seen. There was significant erythema to the ureter, with some splitting of the ureter at that stricture. I felt it worthwhile to leave a stent indwelling for more than several days, thus I had the string removed from a 24 cm/6 [place] contour stent. This was then passed over the remaining guidewire which had been backloaded through the scope. Fluoroscopic and cystoscopic guidance was used to  Place this. Good proximal and distal curls were seen using fluoroscopy and cystoscopy once a guidewire was removed. The bladder was drained.  The patient was then awakened and taken to the PACU in stable condition. She tolerated the procedure well.

## 2015-04-29 NOTE — Progress Notes (Signed)
  Subjective: Patient reports that nausea and pain persists. She is having no fever.  Objective: Vital signs in last 24 hours: Temp:  [98.5 F (36.9 C)-98.9 F (37.2 C)] 98.9 F (37.2 C) (05/19 0623) Pulse Rate:  [66-98] 74 (05/19 0623) Resp:  [16-18] 16 (05/19 0623) BP: (105-125)/(56-70) 105/56 mmHg (05/19 0623) SpO2:  [97 %-100 %] 100 % (05/19 0623) Weight:  [165 lb (74.844 kg)] 165 lb (74.844 kg) (05/18 1836)  Intake/Output from previous day: 05/18 0701 - 05/19 0700 In: 411.3 [I.V.:411.3] Out: 1575 [Urine:1375; Emesis/NG output:200] Intake/Output this shift:    Physical Exam:  Constitutional: Vital signs reviewed. WD WN in NAD   Eyes: PERRL, No scleral icterus.   Cardiovascular: RRR Pulmonary/Chest: Normal effort   Lab Results:  Recent Labs  04/28/15 0815  HGB 12.5  HCT 37.7   BMET  Recent Labs  04/28/15 0815  NA 135  K 3.5  CL 105  CO2 19*  GLUCOSE 132*  BUN 13  CREATININE 1.04*  CALCIUM 9.2   No results for input(s): LABPT, INR in the last 72 hours. No results for input(s): LABURIN in the last 72 hours. No results found for this or any previous visit.  Studies/Results: Ct Renal Stone Study  04/28/2015   CLINICAL DATA:  Left flank pain  EXAM: CT ABDOMEN AND PELVIS WITHOUT CONTRAST  TECHNIQUE: Multidetector CT imaging of the abdomen and pelvis was performed following the standard protocol without IV contrast.  COMPARISON:  None.  FINDINGS: Lung bases are unremarkable. Sagittal images of the spine are unremarkable.  Unenhanced liver shows no biliary ductal dilatation. No calcified gallstones are noted within gallbladder. No aortic aneurysm.  Unenhanced pancreas, spleen and adrenal glands are unremarkable. There is mild left hydronephrosis and left hydroureter.  Significant left perinephric stranding and small amount of left perinephric fluid. There is left periureteral stranding. In axial image 71 there is a calcified obstructive calculus in distal left  ureter measures 4 mm about 4.5 cm from right UVJ. There is IUD in place within retroflexed uterus. No adnexal mass. Urinary bladder is under distended. Bilateral distal ureter is unremarkable.  There is calcified nonobstructive calculus in lower pole of the right kidney measures 6 mm.  Moderate stool and gas is noted in right colon. There is no pericecal inflammation. Normal retrocecal appendix partially visualized in axial image 70. The terminal ileum is unremarkable.  No small bowel obstruction.  No ascites or free air.  IMPRESSION: 1. There is mild left hydronephrosis and left hydroureter. Significant left perinephric stranding. Mild left periureteral stranding and fluid. Axial image 71 there is 4 mm calcified obstructive calculus in distal left ureter about 4.5 cm from left UVJ. 2. There is nonobstructive calcified calculus in lower pole of the right kidney measures 6 mm. 3. No pericecal inflammation.  Normal appendix. 4. No small bowel obstruction.   Electronically Signed   By: Natasha MeadLiviu  Pop M.D.   On: 04/28/2015 10:32    Assessment/Plan:    persistent left distal ureteral stone with associated pain and nausea  i discussed further management with the patient and her husband, who was in the room today. Because of her persistent pain, and need formedicinefor this, with associated nausea, I suggested intervention-we discussed ureteroscopy versus lithotripsy.I feel thatwith a distal ureteral stone, ureteroscopy as the way to proceed. Risks and benefits of the procedure were discussed at length. We will proceed in the afternoon.     Chelsea AusDAHLSTEDT, Althia Egolf M 04/29/2015, 12:23 PM

## 2015-04-29 NOTE — Progress Notes (Signed)
Report received from K. Lutterloh, RN. No change from the initial pm assessment. Will continue to monitor and follow the POC. 

## 2015-04-29 NOTE — Transfer of Care (Signed)
Immediate Anesthesia Transfer of Care Note  Patient: Madiline Hinsch  Procedure(s) Performed: Procedure(s): CYSTOSCOPY WITH RETROGRADE PYELOGRAM/LEFT URETEROSCOPY/STONE BASKETRY/ LEFT URETERAL STENT PLACEMENT (Left)  Patient Location: PACU  Anesthesia Type:General  Level of Consciousness: awake, alert , oriented and patient cooperative  Airway & Oxygen Therapy: Patient Spontanous Breathing and Patient connected to face mask oxygen  Post-op Assessment: Report given to RN, Post -op Vital signs reviewed and stable and Patient moving all extremities X 4  Post vital signs: stable  Last Vitals:  Filed Vitals:   04/29/15 1538  BP: 113/58  Pulse: 83  Temp: 37.1 C  Resp: 18    Complications: No apparent anesthesia complications

## 2015-04-29 NOTE — Anesthesia Procedure Notes (Signed)
Procedure Name: LMA Insertion Date/Time: 04/29/2015 4:53 PM Performed by: Anastasio ChampionEVANS, Aiyla Baucom E Pre-anesthesia Checklist: Patient identified, Emergency Drugs available, Suction available, Patient being monitored and Timeout performed Patient Re-evaluated:Patient Re-evaluated prior to inductionOxygen Delivery Method: Circle system utilized Preoxygenation: Pre-oxygenation with 100% oxygen Intubation Type: IV induction Ventilation: Mask ventilation without difficulty LMA: LMA inserted LMA Size: 4.0 Laryngoscope size: proseal with sump to stomach after LMA placed. Tube type: Oral Number of attempts: 1 Dental Injury: Teeth and Oropharynx as per pre-operative assessment

## 2015-04-29 NOTE — Care Management Note (Signed)
Case Management Note  Patient Details  Name: Diamond Hodges MRN: [id number] Date of Birth: 10/05/1976  Subjective/Objection: 39 yo F admitted with Ureteral Calculus. Pt is from home.                   Action/Plan:Anticipate d/c to home with no needs. Continue to follow.    Expected Discharge Date:                 Expected Discharge Plan:  Home/Self Care  In-House Referral:     Discharge planning Services  CM Consult  Post Acute Care Choice:    Choice offered to:     DME Arranged:    DME Agency:     HH Arranged:    HH Agency:     Status of Service:  In process, will continue to follow  Medicare Important Message Given:    Date Medicare IM Given:    Medicare IM give by:    Date Additional Medicare IM Given:    Additional Medicare Important Message give by:     If discussed at Long Length of Stay Meetings, dates discussed:    Additional Comments:  Lanier ClamMahabir, Stylianos Stradling, RN 04/29/2015, 2:44 PM

## 2015-04-30 ENCOUNTER — Encounter (HOSPITAL_COMMUNITY): Payer: Self-pay | Admitting: Urology

## 2015-04-30 MED ORDER — ONDANSETRON HCL 4 MG PO TABS: 4.0000 mg | ORAL_TABLET | Freq: Three times a day (TID) | ORAL | Status: DC | PRN

## 2015-04-30 MED ORDER — OXYBUTYNIN CHLORIDE 5 MG PO TABS: 5.0000 mg | ORAL_TABLET | Freq: Three times a day (TID) | ORAL | Status: DC | PRN

## 2015-04-30 NOTE — Anesthesia Postprocedure Evaluation (Signed)
  Anesthesia Post-op Note  Patient: Diamond Hodges  Procedure(s) Performed: Procedure(s): CYSTOSCOPY WITH RETROGRADE PYELOGRAM/LEFT URETEROSCOPY/STONE BASKETRY/ LEFT URETERAL STENT PLACEMENT (Left)  Patient Location: PACU  Anesthesia Type:General  Level of Consciousness: awake and alert   Airway and Oxygen Therapy: Patient Spontanous Breathing  Post-op Pain: mild  Post-op Assessment: Post-op Vital signs reviewed  Post-op Vital Signs: Reviewed  Last Vitals:  Filed Vitals:   04/30/15 1140  BP:   Pulse:   Temp: 36.8 C  Resp:     Complications: No apparent anesthesia complications

## 2015-04-30 NOTE — Care Management Note (Signed)
Case Management Note  Patient Details  Name: Donald Proseimee Bloodworth MRN: [id number] Date of Birth: 08/24/1976  Subjective/Objective                 Action/Plan:Discharged to home with no d/c needs or orders.    Expected Discharge Date:                Expected Discharge Plan:  Home/Self Care  In-House Referral:     Discharge planning Services  CM Consult  Post Acute Care Choice:    Choice offered to:     DME Arranged:    DME Agency:     HH Arranged:    HH Agency:     Status of Service:  Completed, signed off  Medicare Important Message Given:    Date Medicare IM Given:    Medicare IM give by:    Date Additional Medicare IM Given:    Additional Medicare Important Message give by:     If discussed at Long Length of Stay Meetings, dates discussed:    Additional Comments:  Lanier ClamMahabir, Kalyan Barabas, RN 04/30/2015, 9:04 AM

## 2015-04-30 NOTE — Discharge Instructions (Signed)
POSTOPERATIVE CARE AFTER URETEROSCOPY ° °Stent management ° °*Stents are often left in after ureteroscopy and stone treatment. If left in, they often cause urinary frequency, urgency, occasional blood in the urine, as well as flank discomfort with urination. These are all expected issues, and should resolve after the stent is removed. ° ° °Diet ° °Once you have adequately recovered from anesthesia, you may gradually advance your diet, as tolerated, to your regular diet. ° °Activities ° °You may gradually increase your activities to your normal unrestricted level the day following your procedure. ° °Medications ° °You should resume all preoperative medications. If you are on aspirin-like compounds, you should not resume these until the blood clears from your urine. If given an antibiotic by the surgeon, take these until they are completed. You may also be given, if you have a stent, medications to decrease the urinary frequency and urgency. ° °Pain ° °After ureteroscopy, there may be some pain on the side of the scope. Take your pain medicine for this. Usually, this pain resolves within a day or 2. ° °Fever ° °Please report any fever over 100° to the doctor. ° °

## 2015-04-30 NOTE — Discharge Summary (Signed)
Patient ID: Diamond Hodges MRN: [id number] DOB/AGE: 38/02/1976 38 y.o.  Admit date: 04/28/2015 Discharge date: 04/30/2015  Primary Care Physician:  PROVIDER NOT IN SYSTEM  Discharge Diagnoses:   Present on Admission:  . Ureteral calculus, left  Consults:  None   Discharge Medications:   Medication List    TAKE these medications        cephALEXin 500 MG capsule  Commonly known as:  KEFLEX  Take 1 capsule (500 mg total) by mouth 2 (two) times daily.     naproxen sodium 220 MG tablet  Commonly known as:  ANAPROX  Take 220-440 mg by mouth every 12 (twelve) hours as needed (pain).     nitrofurantoin (macrocrystal-monohydrate) 100 MG capsule  Commonly known as:  MACROBID  Take 100 mg by mouth 2 (two) times daily.     oxybutynin 5 MG tablet  Commonly known as:  DITROPAN  Take 1 tablet (5 mg total) by mouth every 8 (eight) hours as needed for bladder spasms.     oxybutynin 5 MG tablet  Commonly known as:  DITROPAN  Take 1 tablet (5 mg total) by mouth every 8 (eight) hours as needed for bladder spasms.     oxyCODONE 5 MG immediate release tablet  Commonly known as:  Oxy IR/ROXICODONE  Take 1 tablet (5 mg total) by mouth every 4 (four) hours as needed for moderate pain.         Significant Diagnostic Studies:  Ct Renal Stone Study  04/28/2015   CLINICAL DATA:  Left flank pain  EXAM: CT ABDOMEN AND PELVIS WITHOUT CONTRAST  TECHNIQUE: Multidetector CT imaging of the abdomen and pelvis was performed following the standard protocol without IV contrast.  COMPARISON:  None.  FINDINGS: Lung bases are unremarkable. Sagittal images of the spine are unremarkable.  Unenhanced liver shows no biliary ductal dilatation. No calcified gallstones are noted within gallbladder. No aortic aneurysm.  Unenhanced pancreas, spleen and adrenal glands are unremarkable. There is mild left hydronephrosis and left hydroureter.  Significant left perinephric stranding and small amount of left perinephric  fluid. There is left periureteral stranding. In axial image 71 there is a calcified obstructive calculus in distal left ureter measures 4 mm about 4.5 cm from right UVJ. There is IUD in place within retroflexed uterus. No adnexal mass. Urinary bladder is under distended. Bilateral distal ureter is unremarkable.  There is calcified nonobstructive calculus in lower pole of the right kidney measures 6 mm.  Moderate stool and gas is noted in right colon. There is no pericecal inflammation. Normal retrocecal appendix partially visualized in axial image 70. The terminal ileum is unremarkable.  No small bowel obstruction.  No ascites or free air.  IMPRESSION: 1. There is mild left hydronephrosis and left hydroureter. Significant left perinephric stranding. Mild left periureteral stranding and fluid. Axial image 71 there is 4 mm calcified obstructive calculus in distal left ureter about 4.5 cm from left UVJ. 2. There is nonobstructive calcified calculus in lower pole of the right kidney measures 6 mm. 3. No pericecal inflammation.  Normal appendix. 4. No small bowel obstruction.   Electronically Signed   By: Natasha MeadLiviu  Pop M.D.   On: 04/28/2015 10:32    Brief H and P: For complete details please refer to admission H and P, but in brief the patient was admitted for management of an obstructing, significantly symptomatic left distal ureteral stone with associated nausea and vomiting.  Hospital Course:  The patient was admitted for pain management. She persisted  with pain, nausea and vomiting. On the second hospital day it was decided that we would proceed with stone management. She underwent ureteroscopy which she tolerated well. She was discharged on hospital day #3.  Day of Discharge BP 103/52 mmHg  Pulse 65  Temp(Src) 97.7 F (36.5 C) (Oral)  Resp 16  Ht 5\' 7"  (1.702 m)  Wt 165 lb (74.844 kg)  BMI 25.84 kg/m2  SpO2 99%  LMP 04/23/2015  Results for orders placed or performed during the hospital encounter of  04/28/15 (from the past 24 hour(s))  Surgical pcr screen     Status: Abnormal   Collection Time: 04/29/15  2:04 PM  Result Value Ref Range   MRSA, PCR NEGATIVE NEGATIVE   Staphylococcus aureus POSITIVE (A) NEGATIVE    Physical Exam: General: Alert and awake oriented x3 not in any acute distress. HEENT: anicteric sclera, pupils reactive to light and accommodation CVS: S1-S2 clear no murmur rubs or gallops Chest: clear to auscultation bilaterally, no wheezing rales or rhonchi Abdomen: soft nontender, nondistended, normal bowel sounds, no organomegaly Extremities: no cyanosis, clubbing or edema noted bilaterally Neuro: Cranial nerves II-XII intact, no focal neurological deficits  Disposition:  Home  Diet:  No restrictions no restrictions  Activity:  No restrictions   Disposition and Follow-up:     Discharge Instructions    Discharge patient    Complete by:  As directed             Instructions given to patient  TESTS THAT NEED FOLLOW-UP  None  DISCHARGE FOLLOW-UP Follow-up Information    Follow up with Chelsea AusAHLSTEDT, Davinder Haff M, MD.   Specialty:  Urology   Why:  we will call for followup appt   Contact information:   [address] [place] [address] [phone]       Time spent on Discharge:  10 minutes  Signed: Chelsea AusDAHLSTEDT, Verdia Bolt M 04/30/2015, 7:31 AM

## 2016-02-14 ENCOUNTER — Ambulatory Visit (INDEPENDENT_AMBULATORY_CARE_PROVIDER_SITE_OTHER): Payer: BC Managed Care – PPO | Admitting: Family Medicine

## 2016-02-14 ENCOUNTER — Encounter: Payer: Self-pay | Admitting: Family Medicine

## 2016-02-14 VITALS — BP 124/80 | HR 64 | Ht 67.5 in | Wt 180.6 lb

## 2016-02-14 DIAGNOSIS — F329 Major depressive disorder, single episode, unspecified: Secondary | ICD-10-CM | POA: Diagnosis not present

## 2016-02-14 DIAGNOSIS — Z8659 Personal history of other mental and behavioral disorders: Secondary | ICD-10-CM | POA: Diagnosis not present

## 2016-02-14 DIAGNOSIS — F32A Depression, unspecified: Secondary | ICD-10-CM

## 2016-02-14 DIAGNOSIS — F411 Generalized anxiety disorder: Secondary | ICD-10-CM | POA: Diagnosis not present

## 2016-02-14 DIAGNOSIS — Z0000 Encounter for general adult medical examination without abnormal findings: Secondary | ICD-10-CM | POA: Diagnosis not present

## 2016-02-14 LAB — CBC WITH DIFFERENTIAL/PLATELET
BASOS ABS: 0.1 10*3/uL (ref 0.0–0.1)
Basophils Relative: 1 % (ref 0–1)
EOS PCT: 2 % (ref 0–5)
Eosinophils Absolute: 0.2 10*3/uL (ref 0.0–0.7)
HEMATOCRIT: 37.6 % (ref 36.0–46.0)
Hemoglobin: 12.9 g/dL (ref 12.0–15.0)
LYMPHS ABS: 2 10*3/uL (ref 0.7–4.0)
LYMPHS PCT: 21 % (ref 12–46)
MCH: 29.1 pg (ref 26.0–34.0)
MCHC: 34.3 g/dL (ref 30.0–36.0)
MCV: 84.7 fL (ref 78.0–100.0)
MPV: 10.4 fL (ref 8.6–12.4)
Monocytes Absolute: 0.6 10*3/uL (ref 0.1–1.0)
Monocytes Relative: 6 % (ref 3–12)
Neutro Abs: 6.7 10*3/uL (ref 1.7–7.7)
Neutrophils Relative %: 70 % (ref 43–77)
Platelets: 246 10*3/uL (ref 150–400)
RBC: 4.44 MIL/uL (ref 3.87–5.11)
RDW: 13.7 % (ref 11.5–15.5)
WBC: 9.6 10*3/uL (ref 4.0–10.5)

## 2016-02-14 LAB — POCT URINALYSIS DIPSTICK
BILIRUBIN UA: NEGATIVE
GLUCOSE UA: NEGATIVE
Ketones, UA: NEGATIVE
Leukocytes, UA: NEGATIVE
Nitrite, UA: NEGATIVE
PH UA: 6
Protein, UA: NEGATIVE
SPEC GRAV UA: 1.025
Urobilinogen, UA: NEGATIVE

## 2016-02-14 NOTE — Progress Notes (Signed)
Subjective:    Patient ID: Diamond Hodges, female    DOB: 10/30/1976, 40 y.o.   MRN: [id number]  HPI Chief Complaint  Patient presents with  . new pt    new pt cpe. no other concerns. wants cholesterol tested    She is new to the practice and here to establish primary care. She is also here for a complete physical exam. Her previous PCP was [facility]  Other providers: Ob/GYN- [facility] Charlotta Newtonzan. States she saw her in January of this year.  Her last physical was about 6 years and similar for blood work. Denies fever, chills, unexplained weight change, fatigue.   She is thinking she needs an antidepressant, states she feels mildly-moderate depressed. This has been ongoing for about a year. She has a Veterinary surgeoncounselor, Josephina GipElizabeth Atkinson, seeing her for years.  States she can be obsessive with her thinking. States she was prescribed Prozac in past for same but did not start it. She states she is more receptive to the idea of medication now.  Does not have desire to do her usual activities. She has dealt with anxiety and depression in past. Also reports having panic attacks in past. No medications for this. Has been able to talk herself out of it.  Does get joy from activites. Performs her daily activities and goes to work daily.  Reports obsessive thoughts about her daughter and her safety, does not think she is compulsive.  Denies SI or HI. Has thought in past that she might not "mind being dead" but no thoughts of this recently and no plan to harm herself.   LMP: regular every 28 days. paraguard IUD Last pap smear: 12/2015- normal per patient.  Eye exam: never.  Mammogram: first one at 89 for baseline- mother breast cancer at 4747 Colonoscopy: never   Immunizations: flu- never, Tdap- 4 years ago.   Lives with husband, 2 kids 16, 7.  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  Denies smoking, alcohol use, or drug use Family history significant for mental illness - father  with addiction and depression, brothers with addiction and substance abuse.   Wears seatbelt, sunscreen. No guns in home. No domestic violence.   Exercise: no particular pattern, "know what to do but just not doing it".   Diet: good variety but not "healthy per se".   Reviewed allergies, medications, past medical, surgical, family and social.   Review of Systems Review of Systems Constitutional: -fever, -chills, -sweats, -unexpected weight change,-fatigue ENT: -runny nose, -ear pain, -sore throat Cardiology:  -chest pain, -palpitations, -edema Respiratory: -cough, -shortness of breath, -wheezing Gastroenterology: -abdominal pain, -nausea, -vomiting, -diarrhea, -constipation  Hematology: -bleeding or bruising problems Musculoskeletal: -arthralgias, -myalgias, -joint swelling, -back pain, +intermittent neck pain. Ophthalmology: -vision changes Urology: -dysuria, -difficulty urinating, -hematuria, -urinary frequency, -urgency Neurology: -headache, -weakness, -tingling, -numbness        Objective:   Physical Exam BP 124/80 mmHg  Pulse 64  Ht 5' 7.5" (1.715 m)  Wt 180 lb 9.6 oz (81.92 kg)  BMI 27.85 kg/m2  General Appearance:    Alert, cooperative, no distress, appears stated age  Head:    Normocephalic, without obvious abnormality, atraumatic  Eyes:    PERRL, conjunctiva/corneas clear, EOM's intact, fundi    benign  Ears:    Normal TM's and external ear canals  Nose:   Nares normal, mucosa normal, no drainage or sinus   tenderness  Throat:   Lips, mucosa, and tongue normal; teeth and gums  normal  Neck:   Supple, no lymphadenopathy;  thyroid:  no   enlargement/tenderness/nodules; no carotid   bruit or JVD  Back:    Spine nontender, no curvature, ROM normal, no CVA     tenderness  Lungs:     Clear to auscultation bilaterally without wheezes, rales or     ronchi; respirations unlabored  Chest Wall:    No tenderness or deformity   Heart:    Regular rate and rhythm, S1 and S2  normal, no murmur, rub   or gallop  Breast Exam:    Not performed, GYN 12/2015. No axillary lymphadenopathy  Abdomen:     Soft, non-tender, nondistended, normoactive bowel sounds,    no masses, no hepatosplenomegaly  Genitalia:    Not done, had GYN appt 12/2015  Rectal:    Not performed due to age<40 and no related complaints  Extremities:   No clubbing, cyanosis or edema  Pulses:   2+ and symmetric all extremities  Skin:   Skin color, texture, turgor normal, no rashes or lesions  Lymph nodes:   Cervical, supraclavicular, and axillary nodes normal  Neurologic:   CNII-XII intact, normal strength, sensation and gait; reflexes 2+ and symmetric throughout          Psych:   Normal mood, affect, hygiene and grooming.    Urinalysis dipstick: trace of blood, negative otherwise. asymptomatic     Assessment & Plan:  Routine general medical examination at a health care facility - Plan: POCT urinalysis dipstick, CBC with Differential/Platelet, Comprehensive metabolic panel, Lipid panel, TSH  History of panic attacks - Plan: TSH  Depression - Plan: TSH  Generalized anxiety disorder - Plan: TSH   Will follow up on anxiety and depression with possible pharmacological therapy after lab results are back. Recommend that she continue seeing her therapist. She denies SI or HI.  Discussed healthy diet and getting at least 150 minutes of physical activity per week.  Discussed safety and health promotion.  Refuses flu shot, Tdap current per patient. No records available.  Will send for mammogram at age 85, mother with early diagnosis breast cancer.  Will follow up pending labs. She will have medical records sent.

## 2016-02-14 NOTE — Patient Instructions (Signed)

## 2016-02-15 ENCOUNTER — Other Ambulatory Visit: Payer: Self-pay | Admitting: Family Medicine

## 2016-02-15 DIAGNOSIS — F428 Other obsessive-compulsive disorder: Secondary | ICD-10-CM | POA: Insufficient documentation

## 2016-02-15 DIAGNOSIS — F329 Major depressive disorder, single episode, unspecified: Secondary | ICD-10-CM

## 2016-02-15 DIAGNOSIS — Z8659 Personal history of other mental and behavioral disorders: Secondary | ICD-10-CM | POA: Insufficient documentation

## 2016-02-15 DIAGNOSIS — F32A Depression, unspecified: Secondary | ICD-10-CM

## 2016-02-15 DIAGNOSIS — F411 Generalized anxiety disorder: Secondary | ICD-10-CM | POA: Insufficient documentation

## 2016-02-15 LAB — COMPREHENSIVE METABOLIC PANEL
ALBUMIN: 4.4 g/dL (ref 3.6–5.1)
ALK PHOS: 62 U/L (ref 33–115)
ALT: 17 U/L (ref 6–29)
AST: 20 U/L (ref 10–30)
BILIRUBIN TOTAL: 0.4 mg/dL (ref 0.2–1.2)
BUN: 9 mg/dL (ref 7–25)
CALCIUM: 9.5 mg/dL (ref 8.6–10.2)
CO2: 26 mmol/L (ref 20–31)
Chloride: 103 mmol/L (ref 98–110)
Creat: 0.7 mg/dL (ref 0.50–1.10)
Glucose, Bld: 93 mg/dL (ref 65–99)
POTASSIUM: 4.6 mmol/L (ref 3.5–5.3)
Sodium: 139 mmol/L (ref 135–146)
TOTAL PROTEIN: 6.8 g/dL (ref 6.1–8.1)

## 2016-02-15 LAB — LIPID PANEL
CHOL/HDL RATIO: 3.4 ratio (ref <=5.0)
CHOLESTEROL: 164 mg/dL (ref 125–200)
HDL: 48 mg/dL (ref >=46)
LDL Cholesterol: 100 mg/dL (ref <130)
TRIGLYCERIDES: 81 mg/dL (ref <150)
VLDL: 16 mg/dL (ref <30)

## 2016-02-15 LAB — TSH: TSH: 1.43 m[IU]/L

## 2016-02-15 MED ORDER — CITALOPRAM HYDROBROMIDE 20 MG PO TABS: ORAL_TABLET | ORAL | Status: DC

## 2016-02-15 NOTE — Progress Notes (Signed)
Patient would like to start medication for combination anxiety, depression, panic and obsessive thoughts. She has not taken medication in past for this. She is seeing a counselor and I recommend she continue therapy. Will prescribe citalopram. Discussed possible side effects at appointment and was awaiting lab results. We will proceed with pharmacological therapy and follow up in 2 weeks.

## 2016-02-16 NOTE — Progress Notes (Signed)
Pt was notified of med recommendations

## 2016-02-16 NOTE — Progress Notes (Signed)
Left message for pt to call me back 

## 2016-02-22 ENCOUNTER — Telehealth: Payer: Self-pay | Admitting: Internal Medicine

## 2016-02-22 NOTE — Telephone Encounter (Signed)
Pt called and left a message stating that she took med wed or Thursday 1/2 tablet for 3-4 days but woke up Sunday in the middle of night with heart racing and tingling and didn't feel right, so she googled side effects and others have commented with side effects like that so it scared her, so she has not taken it anymore and wanted to see what you recommend

## 2016-02-22 NOTE — Telephone Encounter (Signed)
I am ok with her working with her therapist and not taking medication but if she would like to try something different she can let me know.

## 2016-02-22 NOTE — Telephone Encounter (Signed)
Called and left a detailed message for pt about vickie's recomendations

## 2016-07-04 IMAGING — CT CT RENAL STONE PROTOCOL
2 of 3 series · 16 of 32 positions shown, 18 images · non-contrast
Comparison: None.

CLINICAL DATA: Left flank pain

EXAM:
CT ABDOMEN AND PELVIS WITHOUT CONTRAST
TECHNIQUE: Multidetector CT imaging of the abdomen and pelvis was performed
following the standard protocol without IV contrast.

[Series 3: coronal · coronal · 0.84mm/px · 3 of 83 slices shown]
[im 28/83  soft-tissue]
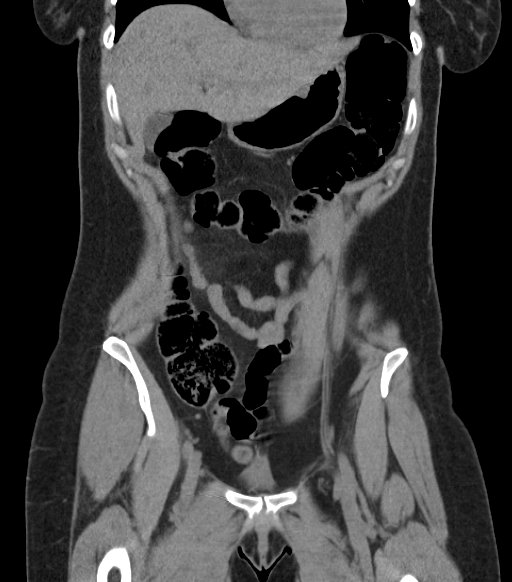
[im 37/83  soft-tissue]
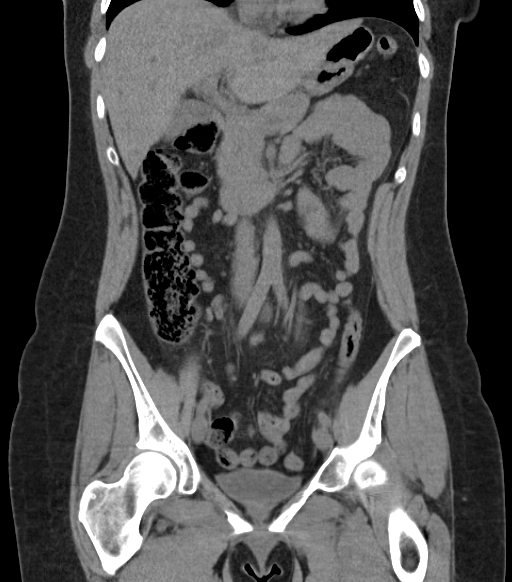
[im 46/83  soft-tissue]
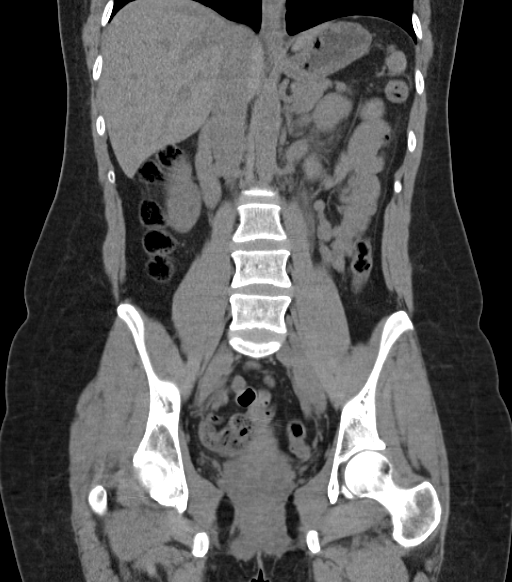

[Series 6: lung · axial · 0.81mm/px · z∈[+1892,+1966]mm · 13 of 18 slices shown, 15 images]
[im 2/18  soft-tissue]
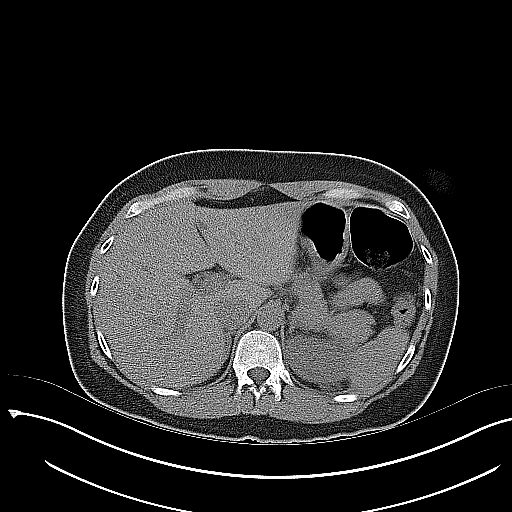
[im 2/18  bone]
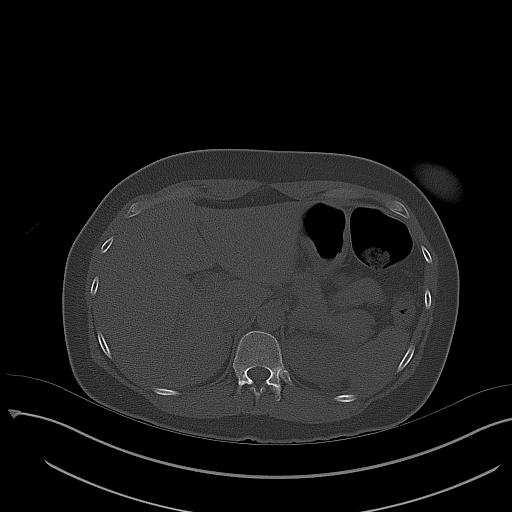
[im 3/18  soft-tissue]
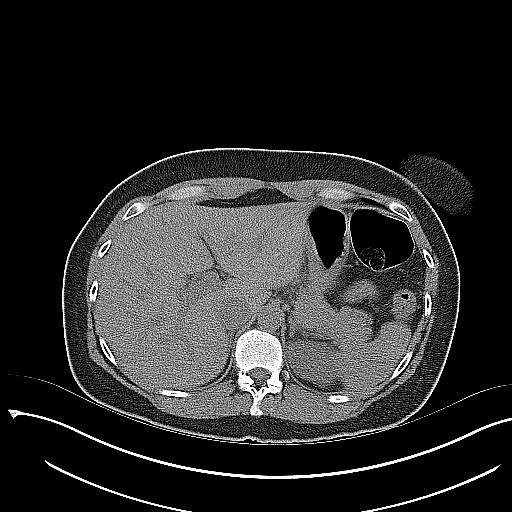
[im 5/18  soft-tissue]
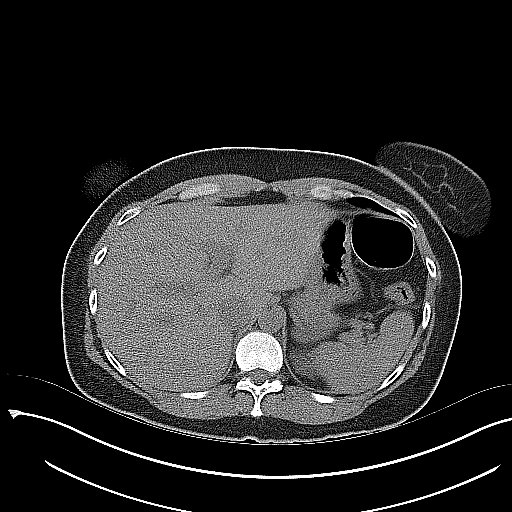
[im 6/18  soft-tissue]
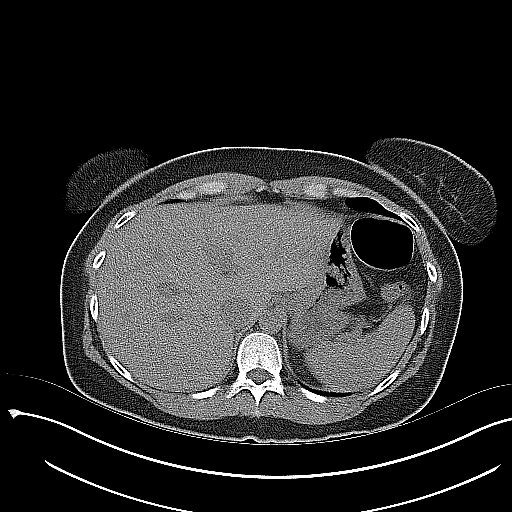
[im 7/18  soft-tissue]
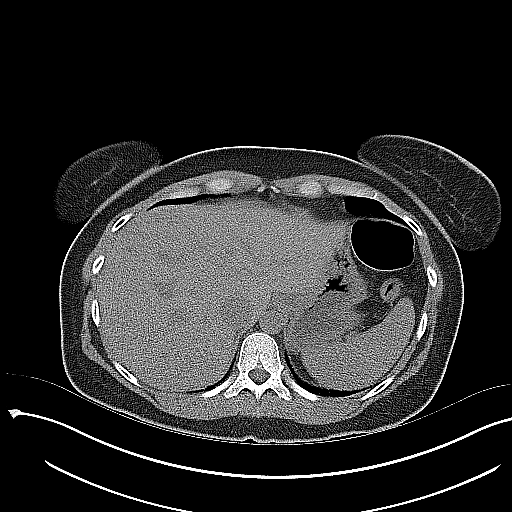
[im 8/18  soft-tissue]
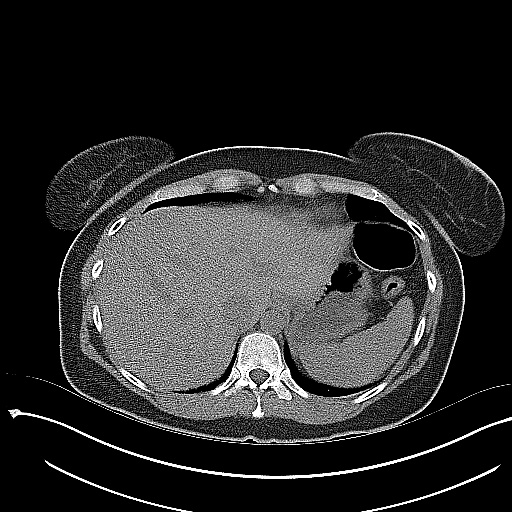
[im 10/18  soft-tissue]
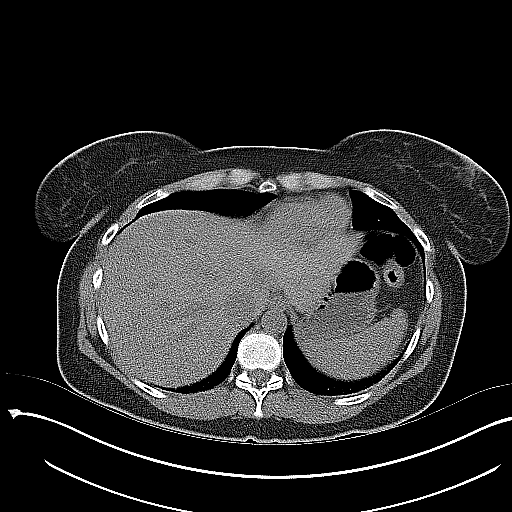
[im 11/18  soft-tissue]
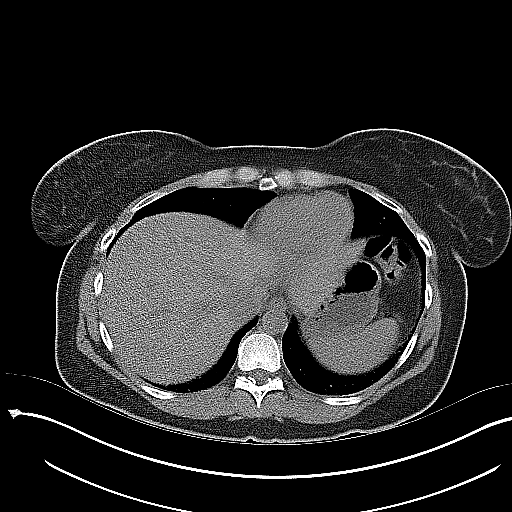
[im 12/18  soft-tissue]
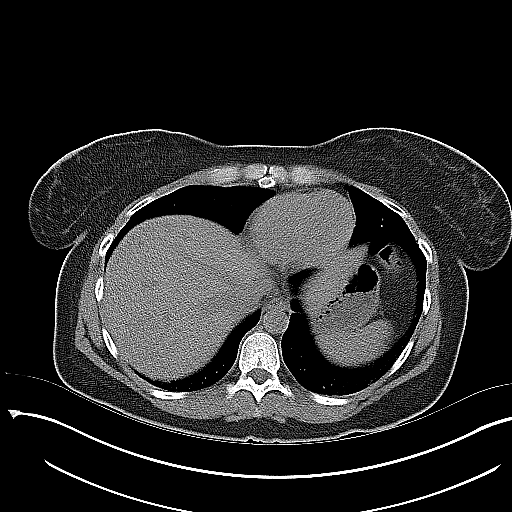
[im 12/18  bone]
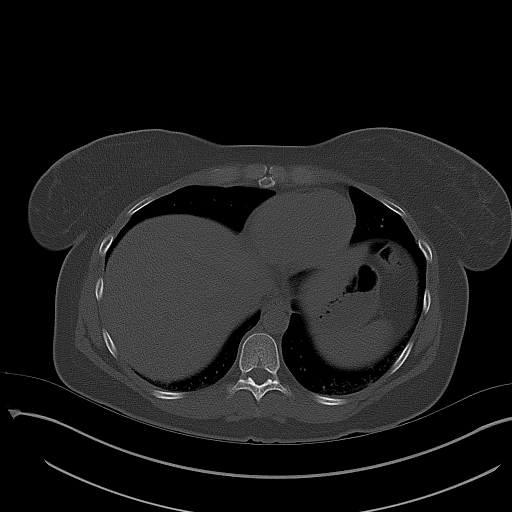
[im 13/18  soft-tissue]
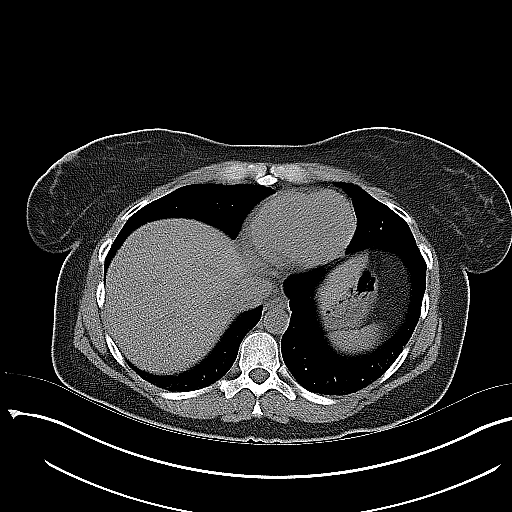
[im 14/18  soft-tissue]
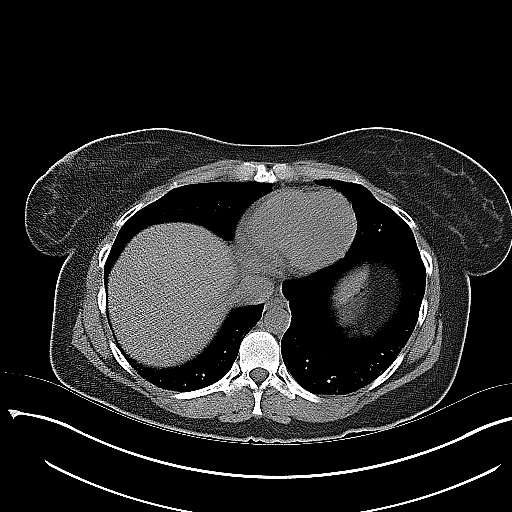
[im 16/18  soft-tissue]
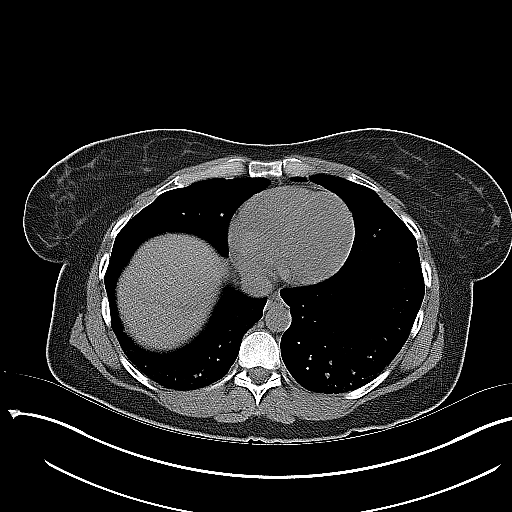
[im 17/18  soft-tissue]
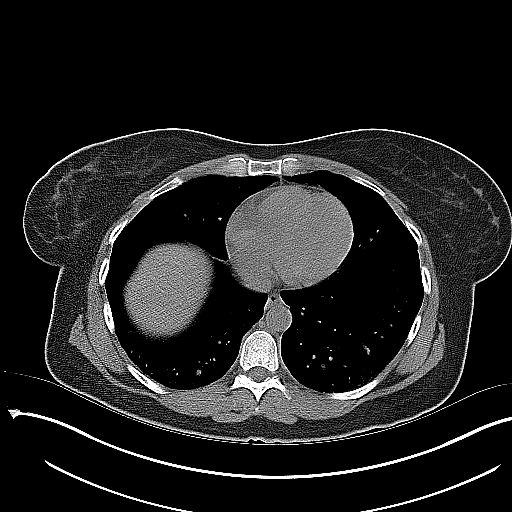

[16 of 32 positions shown; findings below may reference images not displayed]

FINDINGS: Lung bases are unremarkable. Sagittal images of the spine are
unremarkable.

Unenhanced liver shows no biliary ductal dilatation. No calcified
gallstones are noted within gallbladder. No aortic aneurysm.

Unenhanced pancreas, spleen and adrenal glands are unremarkable.
There is mild left hydronephrosis and left hydroureter.

Significant left perinephric stranding and small amount of left
perinephric fluid. There is left periureteral stranding. In axial
image 71 there is a calcified obstructive calculus in distal left
ureter measures 4 mm about 4.5 cm from right UVJ. There is IUD in
place within retroflexed uterus. No adnexal mass. Urinary bladder is
under distended. Bilateral distal ureter is unremarkable.

There is calcified nonobstructive calculus in lower pole of the
right kidney measures 6 mm.

Moderate stool and gas is noted in right colon. There is no
pericecal inflammation. Normal retrocecal appendix partially
visualized in axial image 70. The terminal ileum is unremarkable.

No small bowel obstruction.  No ascites or free air.
IMPRESSION: 1. There is mild left hydronephrosis and left hydroureter.
Significant left perinephric stranding. Mild left periureteral
stranding and fluid.
Axial image 71 there is 4 mm calcified obstructive calculus in
distal left ureter about 4.5 cm from left UVJ.
2. There is nonobstructive calcified calculus in lower pole of the
right kidney measures 6 mm.
3. No pericecal inflammation.  Normal appendix.
4. No small bowel obstruction.

## 2017-01-01 ENCOUNTER — Other Ambulatory Visit: Payer: Self-pay | Admitting: Obstetrics & Gynecology

## 2017-01-01 ENCOUNTER — Other Ambulatory Visit (HOSPITAL_COMMUNITY)
Admission: RE | Admit: 2017-01-01 | Discharge: 2017-01-01 | Disposition: A | Discharge status: Home / Self Care | Payer: BC Managed Care – PPO | Source: Ambulatory Visit | Attending: Obstetrics & Gynecology | Admitting: Obstetrics & Gynecology

## 2017-01-01 DIAGNOSIS — Z01419 Encounter for gynecological examination (general) (routine) without abnormal findings: Secondary | ICD-10-CM | POA: Insufficient documentation

## 2017-01-01 DIAGNOSIS — Z1151 Encounter for screening for human papillomavirus (HPV): Secondary | ICD-10-CM | POA: Insufficient documentation

## 2017-01-03 LAB — CYTOLOGY - PAP
DIAGNOSIS: NEGATIVE
HPV: NOT DETECTED

## 2017-02-14 ENCOUNTER — Other Ambulatory Visit: Payer: Self-pay | Admitting: Obstetrics & Gynecology

## 2018-01-07 ENCOUNTER — Other Ambulatory Visit: Payer: Self-pay | Admitting: Obstetrics & Gynecology

## 2018-01-07 DIAGNOSIS — Z1231 Encounter for screening mammogram for malignant neoplasm of breast: Secondary | ICD-10-CM

## 2018-01-25 ENCOUNTER — Ambulatory Visit
Admission: RE | Admit: 2018-01-25 | Discharge: 2018-01-25 | Disposition: A | Discharge status: Home / Self Care | Payer: BC Managed Care – PPO | Source: Ambulatory Visit | Attending: Obstetrics & Gynecology | Admitting: Obstetrics & Gynecology

## 2018-01-25 DIAGNOSIS — Z1231 Encounter for screening mammogram for malignant neoplasm of breast: Secondary | ICD-10-CM

## 2018-03-12 ENCOUNTER — Encounter: Payer: Self-pay | Admitting: Family Medicine

## 2018-03-12 ENCOUNTER — Ambulatory Visit: Payer: BC Managed Care – PPO | Admitting: Family Medicine

## 2018-03-12 VITALS — BP 110/70 | HR 95 | Ht 66.0 in | Wt 169.8 lb

## 2018-03-12 DIAGNOSIS — Z0000 Encounter for general adult medical examination without abnormal findings: Secondary | ICD-10-CM

## 2018-03-12 DIAGNOSIS — Z13220 Encounter for screening for lipoid disorders: Secondary | ICD-10-CM | POA: Diagnosis not present

## 2018-03-12 DIAGNOSIS — Z87442 Personal history of urinary calculi: Secondary | ICD-10-CM | POA: Diagnosis not present

## 2018-03-12 LAB — POCT URINALYSIS DIP (PROADVANTAGE DEVICE)
BILIRUBIN UA: NEGATIVE
BILIRUBIN UA: NEGATIVE mg/dL
Glucose, UA: NEGATIVE mg/dL
LEUKOCYTES UA: NEGATIVE
Nitrite, UA: NEGATIVE
PH UA: 6 (ref 5.0–8.0)
Protein Ur, POC: NEGATIVE mg/dL
SPECIFIC GRAVITY, URINE: 1.03
Urobilinogen, Ur: NEGATIVE

## 2018-03-12 NOTE — Patient Instructions (Signed)
Make sure you are getting at least 150 minutes of physical activity per week.   Call and follow up with your urologist Dr. Retta Diones.   It was a pleasure seeing you today and we will call you with your lab results.   Preventative Care for Adults - Female      MAINTAIN REGULAR HEALTH EXAMS:  A routine yearly physical is a good way to check in with your primary care provider about your health and preventive screening. It is also an opportunity to share updates about your health and any concerns you have, and receive a thorough all-over exam.   Most health insurance companies pay for at least some preventative services.  Check with your health plan for specific coverages.  WHAT PREVENTATIVE SERVICES DO WOMEN NEED?  Adult women should have their weight and blood pressure checked regularly.   Women age 41 and older should have their cholesterol levels checked regularly.  Women should be screened for cervical cancer with a Pap smear and pelvic exam beginning at age 41.  Breast cancer screening generally begins at age 41 with a mammogram and breast exam by your primary care provider.    Beginning at age 41 and continuing to age 70, women should be screened for colorectal cancer.  Certain people may need continued testing until age 41.  Updating vaccinations is part of preventative care.  Vaccinations help protect against diseases such as the flu.  Osteoporosis is a disease in which the bones lose minerals and strength as we age. Women ages 39 and over should discuss this with their caregivers, as should women after menopause who have other risk factors.  Lab tests are generally done as part of preventative care to screen for anemia and blood disorders, to screen for problems with the kidneys and liver, to screen for bladder problems, to check blood sugar, and to check your cholesterol level.  Preventative services generally include counseling about diet, exercise, avoiding tobacco, drugs,  excessive alcohol consumption, and sexually transmitted infections.    GENERAL RECOMMENDATIONS FOR GOOD HEALTH:  Healthy diet:  Eat a variety of foods, including fruit, vegetables, animal or vegetable protein, such as meat, fish, chicken, and eggs, or beans, lentils, tofu, and grains, such as rice.  Drink plenty of water daily.  Decrease saturated fat in the diet, avoid lots of red meat, processed foods, sweets, fast foods, and fried foods.  Exercise:  Aerobic exercise helps maintain good heart health. At least 30-40 minutes of moderate-intensity exercise is recommended. For example, a brisk walk that increases your heart rate and breathing. This should be done on most days of the week.   Find a type of exercise or a variety of exercises that you enjoy so that it becomes a part of your daily life.  Examples are running, walking, swimming, water aerobics, and biking.  For motivation and support, explore group exercise such as aerobic class, spin class, Zumba, Yoga,or  martial arts, etc.    Set exercise goals for yourself, such as a certain weight goal, walk or run in a race such as a 5k walk/run.  Speak to your primary care provider about exercise goals.  Disease prevention:  If you smoke or chew tobacco, find out from your caregiver how to quit. It can literally save your life, no matter how long you have been a tobacco user. If you do not use tobacco, never begin.   Maintain a healthy diet and normal weight. Increased weight leads to problems with blood  pressure and diabetes.   The Body Mass Index or BMI is a way of measuring how much of your body is fat. Having a BMI above 27 increases the risk of heart disease, diabetes, hypertension, stroke and other problems related to obesity. Your caregiver can help determine your BMI and based on it develop an exercise and dietary program to help you achieve or maintain this important measurement at a healthful level.  High blood pressure causes  heart and blood vessel problems.  Persistent high blood pressure should be treated with medicine if weight loss and exercise do not work.   Fat and cholesterol leaves deposits in your arteries that can block them. This causes heart disease and vessel disease elsewhere in your body.  If your cholesterol is found to be high, or if you have heart disease or certain other medical conditions, then you may need to have your cholesterol monitored frequently and be treated with medication.   Ask if you should have a cardiac stress test if your history suggests this. A stress test is a test done on a treadmill that looks for heart disease. This test can find disease prior to there being a problem.  Menopause can be associated with physical symptoms and risks. Hormone replacement therapy is available to decrease these. You should talk to your caregiver about whether starting or continuing to take hormones is right for you.   Osteoporosis is a disease in which the bones lose minerals and strength as we age. This can result in serious bone fractures. Risk of osteoporosis can be identified using a bone density scan. Women ages 26 and over should discuss this with their caregivers, as should women after menopause who have other risk factors. Ask your caregiver whether you should be taking a calcium supplement and Vitamin D, to reduce the rate of osteoporosis.   Avoid drinking alcohol in excess (more than two drinks per day).  Avoid use of street drugs. Do not share needles with anyone. Ask for professional help if you need assistance or instructions on stopping the use of alcohol, cigarettes, and/or drugs.  Brush your teeth twice a day with fluoride toothpaste, and floss once a day. Good oral hygiene prevents tooth decay and gum disease. The problems can be painful, unattractive, and can cause other health problems. Visit your dentist for a routine oral and dental check up and preventive care every 6-12 months.    Look at your skin regularly.  Use a mirror to look at your back. Notify your caregivers of changes in moles, especially if there are changes in shapes, colors, a size larger than a pencil eraser, an irregular border, or development of new moles.  Safety:  Use seatbelts 100% of the time, whether driving or as a passenger.  Use safety devices such as hearing protection if you work in environments with loud noise or significant background noise.  Use safety glasses when doing any work that could send debris in to the eyes.  Use a helmet if you ride a bike or motorcycle.  Use appropriate safety gear for contact sports.  Talk to your caregiver about gun safety.  Use sunscreen with a SPF (or skin protection factor) of 15 or greater.  Lighter skinned people are at a greater risk of skin cancer. Don't forget to also wear sunglasses in order to protect your eyes from too much damaging sunlight. Damaging sunlight can accelerate cataract formation.   Practice safe sex. Use condoms. Condoms are used for birth control  and to help reduce the spread of sexually transmitted infections (or STIs).  Some of the STIs are gonorrhea (the clap), chlamydia, syphilis, trichomonas, herpes, HPV (human papilloma virus) and HIV (human immunodeficiency virus) which causes AIDS. The herpes, HIV and HPV are viral illnesses that have no cure. These can result in disability, cancer and death.   Keep carbon monoxide and smoke detectors in your home functioning at all times. Change the batteries every 6 months or use a model that plugs into the wall.   Vaccinations:  Stay up to date with your tetanus shots and other required immunizations. You should have a booster for tetanus every 10 years. Be sure to get your flu shot every year, since 5%-20% of the [place] population comes down with the flu. The flu vaccine changes each year, so being vaccinated once is not enough. Get your shot in the fall, before the flu season peaks.   Other  vaccines to consider:  Human Papilloma Virus or HPV causes cancer of the cervix, and other infections that can be transmitted from person to person. There is a vaccine for HPV, and females should get immunized between the ages of 7211 and 3626. It requires a series of 3 shots.   Pneumococcal vaccine to protect against certain types of pneumonia.  This is normally recommended for adults age 41 or older.  However, adults younger than 41 years old with certain underlying conditions such as diabetes, heart or lung disease should also receive the vaccine.  Shingles vaccine to protect against Varicella Zoster if you are older than age 41, or younger than 41 years old with certain underlying illness.  Hepatitis A vaccine to protect against a form of infection of the liver by a virus acquired from food.  Hepatitis B vaccine to protect against a form of infection of the liver by a virus acquired from blood or body fluids, particularly if you work in health care.  If you plan to travel internationally, check with your local health department for specific vaccination recommendations.  Cancer Screening:  Breast cancer screening is essential to preventive care for women. All women age 41 and older should perform a breast self-exam every month. At age 41 and older, women should have their caregiver complete a breast exam each year. Women at ages 4440 and older should have a mammogram (x-ray film) of the breasts. Your caregiver can discuss how often you need mammograms.    Cervical cancer screening includes taking a Pap smear (sample of cells examined under a microscope) from the cervix (end of the uterus). It also includes testing for HPV (Human Papilloma Virus, which can cause cervical cancer). Screening and a pelvic exam should begin at age 721. Screening should occur every year, with a Pap smear but no HPV testing, up to age 41. After age 41, you should have a Pap smear every 3 years with HPV testing, if no HPV was  found previously.   Most routine colon cancer screening begins at the age of 10850. On a yearly basis, doctors may provide special easy to use take-home tests to check for hidden blood in the stool. Sigmoidoscopy or colonoscopy can detect the earliest forms of colon cancer and is life saving. These tests use a small camera at the end of a tube to directly examine the colon. Speak to your caregiver about this at age 41, when routine screening begins (and is repeated every 5 years unless early forms of pre-cancerous polyps or small growths are found).

## 2018-03-12 NOTE — Progress Notes (Signed)
Subjective:    Patient ID: Diamond Hodges, female    DOB: 09/11/1976, 42 y.o.   MRN: [id number]  HPI Chief Complaint  Patient presents with  . fasting cpe    fasting cpe   She is here for a complete physical exam. Last CPE: 2 years ago   History of kidney stone and this was seen on XR. She is planning to follow up with her urologist. Denies pain or urinary symptoms today.  States she went to the UC in March for nausea and upper back pain and was told symptoms may have been related to kidney stone.    Other providers: Dr. 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 October 2013    Depression screen [facility] 2/9 03/12/2018 02/14/2016  Decreased Interest 0 2  Down, Depressed, Hopeless 0 2  PHQ - 2 Score 0 4  Altered sleeping - 2  Tired, decreased energy - 2  Change in appetite - 2  Feeling bad or failure about yourself  - 1  Trouble concentrating - 1  Moving slowly or fidgety/restless - 0  Suicidal thoughts - 0  PHQ-9 Score - 12  Difficult doing work/chores - Somewhat difficult    Health maintenance:  Mammogram: February  Colonoscopy: never  Last Gynecological Exam: February  Last Menstrual cycle: now   Last Dental Exam: twice annually  Last Eye Exam: years   Wears seatbelt always, uses sunscreen, smoke detectors in home and functioning, does not text while driving and feels safe in home environment.   Reviewed allergies, medications, past medical, surgical, family, and social history.     Review of Systems Review of Systems Constitutional: -fever, -chills, -sweats, -unexpected weight change,-fatigue ENT: -runny nose, -ear pain, -sore throat Cardiology:  -chest pain, -palpitations, -edema Respiratory: -cough, -shortness of breath, -wheezing Gastroenterology: -abdominal pain, -nausea, -vomiting,  -diarrhea, -constipation  Hematology: -bleeding or bruising problems Musculoskeletal: -arthralgias, -myalgias, -joint swelling, -back pain Ophthalmology: -vision changes Urology: -dysuria, -difficulty urinating, -hematuria, -urinary frequency, -urgency Neurology: -headache, -weakness, -tingling, -numbness       Objective:   Physical Exam BP 110/70   Pulse 95   Ht 5\' 6"  (1.676 m)   Wt 169 lb 12.8 oz (77 kg)   LMP 03/11/2018   BMI 27.41 kg/m   General Appearance:    Alert, cooperative, no distress, appears stated age  Head:    Normocephalic, without obvious abnormality, atraumatic  Eyes:    PERRL, conjunctiva/corneas clear, EOM's intact, fundi    benign  Ears:    Normal TM's and external ear canals  Nose:   Nares normal, mucosa normal, no drainage or sinus   tenderness  Throat:   Lips, mucosa, and tongue normal; teeth and gums normal  Neck:   Supple, no lymphadenopathy;  thyroid:  no   enlargement/tenderness/nodules; no carotid   bruit or JVD  Back:    Spine nontender, no curvature, ROM normal, no CVA     tenderness  Lungs:     Clear to auscultation bilaterally without wheezes, rales or     ronchi; respirations unlabored  Chest Wall:    No tenderness or deformity   Heart:    Regular rate and rhythm, S1 and S2 normal, no murmur, rub   or gallop  Breast Exam:    OB/GYN  Abdomen:  Soft, non-tender, nondistended, normoactive bowel sounds,    no masses, no hepatosplenomegaly  Genitalia:    OB/GYN  Rectal:    Not performed due to age<40 and no related complaints  Extremities:   No clubbing, cyanosis or edema  Pulses:   2+ and symmetric all extremities  Skin:   Skin color, texture, turgor normal, no rashes or lesions  Lymph nodes:   Cervical, supraclavicular, and axillary nodes normal  Neurologic:   CNII-XII intact, normal strength, sensation and gait; reflexes 2+ and symmetric throughout          Psych:   Normal mood, affect, hygiene and grooming.    Urinalysis dipstick: blood  (menses) negative otherwise.       Assessment & Plan:  Routine general medical examination at a health care facility - Plan: POCT Urinalysis DIP (Proadvantage Device), CBC with Differential/Platelet, Comprehensive metabolic panel, TSH, Lipid panel  History of renal stone  Screening for lipid disorders - Plan: Lipid panel  She appears to be doing well physically and emotionally. Up-to-date with health maintenance and immunizations. Plans to see a dermatologist for a skin check.  Discussed safety and health promotion. Recommend she continue with healthy lifestyle, healthy diet and exercise. She is fasting we will check a lipid panel. Follow-up pending labs or as needed.

## 2018-03-13 LAB — COMPREHENSIVE METABOLIC PANEL
ALBUMIN: 4.8 g/dL (ref 3.5–5.5)
ALK PHOS: 64 IU/L (ref 39–117)
ALT: 12 IU/L (ref 0–32)
AST: 19 IU/L (ref 0–40)
Albumin/Globulin Ratio: 1.9 (ref 1.2–2.2)
BILIRUBIN TOTAL: 0.4 mg/dL (ref 0.0–1.2)
BUN / CREAT RATIO: 16 (ref 9–23)
BUN: 14 mg/dL (ref 6–24)
CHLORIDE: 104 mmol/L (ref 96–106)
CO2: 19 mmol/L — AB (ref 20–29)
CREATININE: 0.9 mg/dL (ref 0.57–1.00)
Calcium: 9.7 mg/dL (ref 8.7–10.2)
GFR calc Af Amer: 92 mL/min/{1.73_m2} (ref >59)
GFR calc non Af Amer: 80 mL/min/{1.73_m2} (ref >59)
GLOBULIN, TOTAL: 2.5 g/dL (ref 1.5–4.5)
GLUCOSE: 87 mg/dL (ref 65–99)
Potassium: 4.4 mmol/L (ref 3.5–5.2)
SODIUM: 143 mmol/L (ref 134–144)
Total Protein: 7.3 g/dL (ref 6.0–8.5)

## 2018-03-13 LAB — CBC WITH DIFFERENTIAL/PLATELET
BASOS: 1 %
Basophils Absolute: 0 10*3/uL (ref 0.0–0.2)
EOS (ABSOLUTE): 0.2 10*3/uL (ref 0.0–0.4)
Eos: 3 %
HEMOGLOBIN: 13.3 g/dL (ref 11.1–15.9)
Hematocrit: 39.9 % (ref 34.0–46.6)
IMMATURE GRANS (ABS): 0 10*3/uL (ref 0.0–0.1)
Immature Granulocytes: 0 %
LYMPHS ABS: 1.7 10*3/uL (ref 0.7–3.1)
Lymphs: 24 %
MCH: 28.9 pg (ref 26.6–33.0)
MCHC: 33.3 g/dL (ref 31.5–35.7)
MCV: 87 fL (ref 79–97)
MONOCYTES: 5 %
Monocytes Absolute: 0.4 10*3/uL (ref 0.1–0.9)
NEUTROS ABS: 4.6 10*3/uL (ref 1.4–7.0)
Neutrophils: 67 %
Platelets: 240 10*3/uL (ref 150–379)
RBC: 4.61 x10E6/uL (ref 3.77–5.28)
RDW: 13.5 % (ref 12.3–15.4)
WBC: 6.8 10*3/uL (ref 3.4–10.8)

## 2018-03-13 LAB — LIPID PANEL
CHOLESTEROL TOTAL: 185 mg/dL (ref 100–199)
Chol/HDL Ratio: 4.2 ratio (ref 0.0–4.4)
HDL: 44 mg/dL (ref >39)
LDL Calculated: 126 mg/dL — ABNORMAL HIGH (ref 0–99)
Triglycerides: 75 mg/dL (ref 0–149)
VLDL Cholesterol Cal: 15 mg/dL (ref 5–40)

## 2018-03-13 LAB — TSH: TSH: 2.35 u[IU]/mL (ref 0.450–4.500)

## 2018-04-10 ENCOUNTER — Encounter: Payer: Self-pay | Admitting: Family Medicine

## 2018-04-29 ENCOUNTER — Encounter: Payer: Self-pay | Admitting: Neurology

## 2018-04-29 ENCOUNTER — Ambulatory Visit: Payer: BC Managed Care – PPO | Admitting: Family Medicine

## 2018-04-29 ENCOUNTER — Encounter: Payer: Self-pay | Admitting: Family Medicine

## 2018-04-29 VITALS — BP 110/70 | HR 76 | Temp 98.6°F | Ht 67.25 in | Wt 164.8 lb

## 2018-04-29 DIAGNOSIS — R202 Paresthesia of skin: Secondary | ICD-10-CM | POA: Diagnosis not present

## 2018-04-29 DIAGNOSIS — R319 Hematuria, unspecified: Secondary | ICD-10-CM | POA: Insufficient documentation

## 2018-04-29 DIAGNOSIS — M533 Sacrococcygeal disorders, not elsewhere classified: Secondary | ICD-10-CM | POA: Diagnosis not present

## 2018-04-29 LAB — POCT URINALYSIS DIP (PROADVANTAGE DEVICE)
BILIRUBIN UA: NEGATIVE
Glucose, UA: NEGATIVE mg/dL
Ketones, POC UA: NEGATIVE mg/dL
Leukocytes, UA: NEGATIVE
NITRITE UA: NEGATIVE
PH UA: 6 (ref 5.0–8.0)
PROTEIN UA: NEGATIVE mg/dL
Specific Gravity, Urine: 1.02
Urobilinogen, Ur: NEGATIVE

## 2018-04-29 LAB — POCT URINE PREGNANCY: Preg Test, Ur: NEGATIVE

## 2018-04-29 MED ORDER — MELOXICAM 7.5 MG PO TABS
7.5000 mg | ORAL_TABLET | Freq: Every day | ORAL | 0 refills | Status: DC
Start: 2018-04-29 — End: 2018-05-26

## 2018-04-29 NOTE — Patient Instructions (Signed)

## 2018-04-29 NOTE — Progress Notes (Signed)
Subjective:    Patient ID: Donald Prose, female    DOB: 10-Jul-1976, 42 y.o.   MRN: [id number]  HPI Chief Complaint  Patient presents with  . tingling    tingling in hands and feet, and low back pain- started about a month ago and will come and go.    She is here with complaints of intermittent numbness and tingling in her hands, fingers and toes. Reports all of her fingers are involved when symptoms are present. This is occurring weekly and not daily. States she did not have any symptoms of this over the weekend until last night when she was awakened with her right hand being numb and tingling. This resolved after a minute of so. Denies any weakness. Denies numbness or tingling today.   Also complains of pain in her deep sacral region daily with certain positions. Occasional radiation to bilateral lateral and anterior hips. Pain is improved with rest or ibuprofen temporarily.  No known injury. She has been doing yoga at home prior to onset of symptoms.  Using ibuprofen, ice, heat and stretches.   No falls or difficulty with balance.  No alcohol use.   Denies fever, chills, unexplained weight loss, vision changes, dizziness, neck pain, chest pain, palpitations, shortness of breath, abdominal pain, N/V/D, urinary symptoms.   Medications include Vistaril prn and MVI and fish oil.   Is being followed by urology for renal stone. History of hematuria. States she has an appt next month with urologist.   LMP: April 04 2018. Some spotting yesterday.   Past Medical History:  Diagnosis Date  . Allergy    seasonal  . Anxiety   . Chronic kidney disease   . Depression   . History of vertigo   . Panic attack   . Renal calculi    Past Surgical History:  Procedure Laterality Date  . CESAREAN SECTION    . CYSTOSCOPY W/ URETERAL STENT PLACEMENT Left 04/29/2015   Procedure: CYSTOSCOPY WITH RETROGRADE PYELOGRAM/LEFT URETEROSCOPY/STONE BASKETRY/ LEFT URETERAL STENT PLACEMENT;  Surgeon: Marcine Matar, MD;  Location: [facility];  Service: Urology;  Laterality: Left;   Current Outpatient Medications on File Prior to Visit  Medication Sig Dispense Refill  . hydrOXYzine (VISTARIL) 25 MG capsule Take 25 mg by mouth as needed.    . IBUPROFEN PO Take by mouth.     No current facility-administered medications on file prior to visit.    Reviewed allergies, medications, past medical, surgical, family, and social history.    Review of Systems Pertinent positives and negatives in the history of present illness.     Objective:   Physical Exam  Constitutional: She is oriented to person, place, and time. She appears well-developed and well-nourished. No distress.  HENT:  Head: Normocephalic and atraumatic.  Right Ear: Hearing, tympanic membrane and ear canal normal.  Left Ear: Hearing and ear canal normal.  Eyes: Pupils are equal, round, and reactive to light. Conjunctivae, EOM and lids are normal.  Neck: Normal range of motion. Neck supple.  Cardiovascular: Normal rate, regular rhythm, normal heart sounds and intact distal pulses.  No LE edema  Pulmonary/Chest: Effort normal and breath sounds normal.  Musculoskeletal: Normal range of motion.  Lymphadenopathy:    She has no cervical adenopathy.       Right: No supraclavicular adenopathy present.       Left: No supraclavicular adenopathy present.  Neurological: She is alert and oriented to person, place, and time. She has normal strength and normal reflexes.  No cranial nerve deficit or sensory deficit. Coordination and gait normal.  Negative straight leg raise  Skin: Skin is warm and dry. Capillary refill takes less than 2 seconds. No rash noted.  Psychiatric: She has a normal mood and affect. Her speech is normal and behavior is normal. Thought content normal.   BP 110/70   Pulse 76   Temp 98.6 F (37 C) (Oral)   Ht 5' 7.25" (1.708 m)   Wt 164 lb 12.8 oz (74.8 kg)   SpO2 98%   BMI 25.62 kg/m    Urinalysis dipstick: 1+  blood (?menses or chronic due to renal stone) negative otherwise. UPT negative     Assessment & Plan:  Paresthesias - Plan: CBC with Differential/Platelet, Comprehensive metabolic panel, Sedimentation rate, TSH, Vitamin B12, Ambulatory referral to Neurology, HIV antibody  Sacral back pain - Plan: POCT Urinalysis DIP (Proadvantage Device), meloxicam (MOBIC) 7.5 MG tablet, POCT urine pregnancy  Hematuria, unspecified type  Discussed that her neurological exam is unremarkable.  Intermittent paresthesias that are intermittent and changing locations.  Unclear etiology. Will check labs. No systemic symptoms.  Discussed conservative treatment for low sacral pain. Try meloxicam, ice or heat and continue doing stretches. Discussed holding off on XR for now. She has close contact with physical therapists and has tried their recommendations as well.  Will put in referral to neurology. She will keep a journal of her symptoms.  She is aware of hematuria and is being followed by urology. Has a follow up next month and will let them know.

## 2018-04-30 ENCOUNTER — Encounter: Payer: Self-pay | Admitting: Family Medicine

## 2018-04-30 ENCOUNTER — Other Ambulatory Visit: Payer: Self-pay | Admitting: Family Medicine

## 2018-04-30 DIAGNOSIS — E538 Deficiency of other specified B group vitamins: Secondary | ICD-10-CM

## 2018-04-30 HISTORY — DX: Deficiency of other specified B group vitamins: E53.8

## 2018-04-30 LAB — CBC WITH DIFFERENTIAL/PLATELET
BASOS ABS: 0 10*3/uL (ref 0.0–0.2)
BASOS: 1 %
EOS (ABSOLUTE): 0.1 10*3/uL (ref 0.0–0.4)
Eos: 2 %
Hematocrit: 40.1 % (ref 34.0–46.6)
Hemoglobin: 13.1 g/dL (ref 11.1–15.9)
IMMATURE GRANS (ABS): 0 10*3/uL (ref 0.0–0.1)
Immature Granulocytes: 0 %
LYMPHS: 16 %
Lymphocytes Absolute: 1.2 10*3/uL (ref 0.7–3.1)
MCH: 28.1 pg (ref 26.6–33.0)
MCHC: 32.7 g/dL (ref 31.5–35.7)
MCV: 86 fL (ref 79–97)
Monocytes Absolute: 0.5 10*3/uL (ref 0.1–0.9)
Monocytes: 6 %
Neutrophils Absolute: 5.8 10*3/uL (ref 1.4–7.0)
Neutrophils: 75 %
PLATELETS: 245 10*3/uL (ref 150–450)
RBC: 4.66 x10E6/uL (ref 3.77–5.28)
RDW: 13.9 % (ref 12.3–15.4)
WBC: 7.7 10*3/uL (ref 3.4–10.8)

## 2018-04-30 LAB — COMPREHENSIVE METABOLIC PANEL
A/G RATIO: 2 (ref 1.2–2.2)
ALBUMIN: 4.8 g/dL (ref 3.5–5.5)
ALK PHOS: 57 IU/L (ref 39–117)
ALT: 12 IU/L (ref 0–32)
AST: 15 IU/L (ref 0–40)
BILIRUBIN TOTAL: 0.5 mg/dL (ref 0.0–1.2)
BUN / CREAT RATIO: 16 (ref 9–23)
BUN: 13 mg/dL (ref 6–24)
CHLORIDE: 108 mmol/L — AB (ref 96–106)
CO2: 21 mmol/L (ref 20–29)
Calcium: 9.7 mg/dL (ref 8.7–10.2)
Creatinine, Ser: 0.79 mg/dL (ref 0.57–1.00)
GFR calc Af Amer: 108 mL/min/{1.73_m2} (ref >59)
GFR calc non Af Amer: 94 mL/min/{1.73_m2} (ref >59)
GLOBULIN, TOTAL: 2.4 g/dL (ref 1.5–4.5)
GLUCOSE: 95 mg/dL (ref 65–99)
POTASSIUM: 4.9 mmol/L (ref 3.5–5.2)
SODIUM: 143 mmol/L (ref 134–144)
Total Protein: 7.2 g/dL (ref 6.0–8.5)

## 2018-04-30 LAB — TSH: TSH: 1.91 u[IU]/mL (ref 0.450–4.500)

## 2018-04-30 LAB — HIV ANTIBODY (ROUTINE TESTING W REFLEX): HIV Screen 4th Generation wRfx: NONREACTIVE

## 2018-04-30 LAB — VITAMIN B12: Vitamin B-12: 192 pg/mL — ABNORMAL LOW (ref 232–1245)

## 2018-04-30 LAB — SEDIMENTATION RATE: SED RATE: 7 mm/h (ref 0–32)

## 2018-04-30 MED ORDER — VITAMIN B-12 1000 MCG PO TABS: 1000.0000 ug | ORAL_TABLET | Freq: Every day | ORAL | 0 refills | Status: DC

## 2018-04-30 NOTE — Progress Notes (Signed)
   Subjective:    Patient ID: Diamond Hodges, female    DOB: Apr 17, 1976, 42 y.o.   MRN: [id number]  HPI    Review of Systems     Objective:   Physical Exam        Assessment & Plan:

## 2018-05-26 ENCOUNTER — Other Ambulatory Visit: Payer: Self-pay | Admitting: Family Medicine

## 2018-05-26 DIAGNOSIS — M533 Sacrococcygeal disorders, not elsewhere classified: Secondary | ICD-10-CM

## 2018-05-27 NOTE — Telephone Encounter (Signed)
Is this okay to refill? 

## 2018-06-02 ENCOUNTER — Other Ambulatory Visit: Payer: Self-pay | Admitting: Family Medicine

## 2018-06-02 DIAGNOSIS — E538 Deficiency of other specified B group vitamins: Secondary | ICD-10-CM

## 2018-06-10 ENCOUNTER — Ambulatory Visit: Payer: BC Managed Care – PPO | Admitting: Family Medicine

## 2018-06-10 ENCOUNTER — Encounter: Payer: Self-pay | Admitting: Family Medicine

## 2018-06-10 VITALS — BP 120/70 | HR 80 | Ht 66.5 in | Wt 167.0 lb

## 2018-06-10 DIAGNOSIS — R202 Paresthesia of skin: Secondary | ICD-10-CM | POA: Diagnosis not present

## 2018-06-10 DIAGNOSIS — E538 Deficiency of other specified B group vitamins: Secondary | ICD-10-CM | POA: Diagnosis not present

## 2018-06-10 NOTE — Patient Instructions (Signed)
Vitamin B12 Deficiency Vitamin B12 deficiency occurs when the body does not have enough vitamin B12. Vitamin B12 is an important vitamin. The body needs vitamin B12:  To make red blood cells.  To make DNA. This is the genetic material inside cells.  To help the nerves work properly so they can carry messages from the brain to the body.  Vitamin B12 deficiency can cause various health problems, such as a low red blood cell count (anemia) or nerve damage. What are the causes? This condition may be caused by:  Not eating enough foods that contain vitamin B12.  Not having enough stomach acid and digestive fluids to properly absorb vitamin B12 from the food that you eat.  Certain digestive system diseases that make it hard to absorb vitamin B12. These diseases include Crohn disease, chronic pancreatitis, and cystic fibrosis.  Pernicious anemia. This is a condition in which the body does not make enough of a protein (intrinsic factor), resulting in too few red blood cells.  Having a surgery in which part of the stomach or small intestine is removed.  Taking certain medicines that make it hard for the body to absorb vitamin B12. These medicines include: ? Heartburn medicine (antacids and proton pump inhibitors). ? An antibiotic medicine called neomycin. ? Some medicines that are used to treat diabetes, tuberculosis, gout, or high cholesterol.  What increases the risk? The following factors may make you more likely to develop a B12 deficiency:  Being older than age 50.  Eating a vegetarian or vegan diet, especially while you are pregnant.  Eating a poor diet while you are pregnant.  Taking certain drugs.  Having alcoholism.  What are the signs or symptoms? In some cases, there are no symptoms of this condition. If the condition leads to anemia or nerve damage, various symptoms can occur, such as:  Weakness.  Fatigue.  Loss of appetite.  Weight loss.  Numbness or tingling  in your hands and feet.  Redness and burning of the tongue.  Confusion or memory problems.  Depression.  Sensory problems, such as color blindness, ringing in the ears, or loss of taste.  Diarrhea or constipation.  Trouble walking.  If anemia is severe, symptoms can include:  Shortness of breath.  Dizziness.  Rapid heart rate (tachycardia).  How is this diagnosed? This condition may be diagnosed with a blood test to measure the level of vitamin B12 in your blood. You may have other tests to help find the cause of your vitamin B12 deficiency. These tests may include:  A complete blood count (CBC). This is a group of tests that measure certain characteristics of blood cells.  A blood test to measure intrinsic factor.  An endoscopy. In this procedure, a thin tube with a camera on the end is used to look into your stomach or intestines.  How is this treated? Treatment for this condition depends on the cause. Common treatment options include:  Changing your eating and drinking habits, such as: ? Eating more foods that contain vitamin B12. ? Drinking less alcohol or no alcohol.  Taking vitamin B12 supplements. Your health care provider will tell you which dosage is best for you.  Getting vitamin B12 injections.  Follow these instructions at home:  Take supplements only as told by your health care provider. Follow the directions carefully.  Get any injections that are prescribed by your health care provider.  Do not miss your appointments.  Eat lots of healthy foods that contain vitamin B12.   Ask your health care provider if you should work with a dietitian. Foods that contain vitamin B12 include: ? Meat. ? Meat from birds (poultry). ? Fish. ? Eggs. ? Cereal and dairy products that are fortified. This means that vitamin B12 has been added to the food. Check the label on the package to see if the food is fortified.  Do not abuse alcohol.  Keep all follow-up visits as  told by your health care provider. This is important. Contact a health care provider if:  Your symptoms come back. Get help right away if:  You develop shortness of breath.  You have chest pain.  You become dizzy or you lose consciousness. This information is not intended to replace advice given to you by your health care provider. Make sure you discuss any questions you have with your health care provider. Document Released: 02/19/2012 Document Revised: 05/10/2016 Document Reviewed: 04/14/2015 Elsevier Interactive Patient Education  2018 Elsevier Inc.  

## 2018-06-10 NOTE — Progress Notes (Signed)
   Subjective:    Patient ID: Diamond Hodges, female    DOB: 10/03/1976, 42 y.o.   MRN: [id number]  HPI Chief Complaint  Patient presents with  . follow-up    follow-up on b12   She is here to follow up on vitamin B12 deficiency. She has been taking oral vitamin B12 1,000 mcg consistently for the past 2 weeks and intermittently  2 weeks prior.  She is also complaining of nausea intermittently. No vomiting.  No chance of pregnancy. Condom use. LMP: 05/30/2018  States her low back is improved. No longer taking meloxicam.   States paresthesias have improved over the past 2 weeks but she still has this intermittently. Sensations are still not consistent.  Tingling has eft great toe or right thumb, tip of her nose. It is still moving from place to place. In general the sensation lasts approximately 1/2 day.   Reports one minute of bilateral LE numbness while sitting on the floor with her legs crossed last week. No recurrence of this since.  She has an appointment with neurology later this month.   No prescription medications. She is not taking OTC antacids. No alcohol use.   Constipation. No hx of IBD.   Denies fever, chills, dizziness, chest pain, palpitations, shortness of breath, abdominal pain, V/D, urinary symptoms, LE edema.   Reviewed allergies, medications, past medical, surgical, family, and social history.   Review of Systems Pertinent positives and negatives in the history of present illness.     Objective:   Physical Exam BP 120/70   Pulse 80   Ht 5' 6.5" (1.689 m)   Wt 167 lb (75.8 kg)   BMI 26.55 kg/m   Alert and oriented and in no acute distress. Not otherwise examined.       Assessment & Plan:  Vitamin B12 deficiency - Plan: Vitamin B12, Folate  Paresthesias  She is taking a vitamin B12 1000 mcg for the past 2 weeks consistently and for 4 weeks total.  We will recheck her B12 level and follow-up. There is no obvious explanation as to why she has low  vitamin B12. She is aware that we may need to change to injectable B12 if no improvement.  She will keep her appointment with neurologist today due to intermittent paresthesias.

## 2018-06-11 ENCOUNTER — Other Ambulatory Visit: Payer: Self-pay | Admitting: Family Medicine

## 2018-06-11 DIAGNOSIS — E538 Deficiency of other specified B group vitamins: Secondary | ICD-10-CM

## 2018-06-11 LAB — FOLATE: FOLATE: 11.8 ng/mL (ref >3.0)

## 2018-06-11 LAB — VITAMIN B12: VITAMIN B 12: 326 pg/mL (ref 232–1245)

## 2018-06-11 MED ORDER — CYANOCOBALAMIN 1000 MCG PO TABS: 1000.0000 ug | ORAL_TABLET | Freq: Every day | ORAL | 1 refills | Status: DC

## 2018-07-04 ENCOUNTER — Other Ambulatory Visit: Payer: Self-pay | Admitting: Family Medicine

## 2018-07-04 ENCOUNTER — Encounter: Payer: Self-pay | Admitting: Family Medicine

## 2018-07-04 DIAGNOSIS — E538 Deficiency of other specified B group vitamins: Secondary | ICD-10-CM

## 2018-07-05 ENCOUNTER — Encounter: Payer: Self-pay | Admitting: Family Medicine

## 2018-07-10 ENCOUNTER — Other Ambulatory Visit: Payer: Self-pay

## 2018-07-10 ENCOUNTER — Encounter: Payer: Self-pay | Admitting: Neurology

## 2018-07-10 ENCOUNTER — Ambulatory Visit: Payer: BC Managed Care – PPO | Admitting: Neurology

## 2018-07-10 ENCOUNTER — Other Ambulatory Visit: Payer: Self-pay | Admitting: Family Medicine

## 2018-07-10 VITALS — BP 106/74 | HR 85 | Ht 67.0 in | Wt 169.0 lb

## 2018-07-10 DIAGNOSIS — R202 Paresthesia of skin: Secondary | ICD-10-CM | POA: Diagnosis not present

## 2018-07-10 DIAGNOSIS — E538 Deficiency of other specified B group vitamins: Secondary | ICD-10-CM | POA: Diagnosis not present

## 2018-07-10 NOTE — Progress Notes (Signed)
NEUROLOGY CONSULTATION NOTE  Diamond Hodges MRN: [id number] DOB: [birth year]  Referring provider: Hetty BlendVickie Henson, NP-C Primary care provider: Hetty BlendVickie Henson, NP-C  Reason for consult:  paresthesias  HISTORY OF PRESENT ILLNESS: Diamond Proseimee Pirie is a [age] year old right-handed female with CKD, kidney stones, depression and anxiety who presents for paresthesias.  History supplemented by referring provider's notes.  In March, she started experiencing intermittent tingling in the fingers and toes. It would occur frequently throughout the day everyday.  Sometimes when she would sit on the floor cross-legged, she would start experiencing tingling and numbness.  One time, she was sitting on the floor with legs straight out in front of her and her entire legs from the buttocks went numb.  She may also notice it when laying down.  There is no associated pain or weakness.  At the time, she has stopped a one month trial of the keto diet.  TSH from 03/12/18 was 2.350.  Labs from 04/29/18 include Sed Rate 7, HIV negative, and B12 192.  She was started on B12 1000 mcg daily.  Since starting the supplements, symptoms have greatly improved.  She still occasionally will experience brief episodes of tingling in the extremities.  Repeat B12 from 06/10/18 was 326.  She says that she started experiencing paresthesias a couple of years ago, but they were previously very brief and infrequent.  On about 3 or 4 occasions, she has also experienced tingling on the tip of her nose and over her eyebrows, lasting 1 to 2 hours.  She denies prior history of visual disturbance or gait abnormality.  She reports history of BPPV.  Her mother had Lyme disease.  PAST MEDICAL HISTORY: Past Medical History:  Diagnosis Date  . Allergy    seasonal  . Anxiety   . Chronic kidney disease   . Depression   . History of vertigo   . Panic attack   . Renal calculi   . Vitamin B12 deficiency 04/30/2018    PAST SURGICAL HISTORY: Past Surgical  History:  Procedure Laterality Date  . CESAREAN SECTION    . CYSTOSCOPY W/ URETERAL STENT PLACEMENT Left 04/29/2015   Procedure: CYSTOSCOPY WITH RETROGRADE PYELOGRAM/LEFT URETEROSCOPY/STONE BASKETRY/ LEFT URETERAL STENT PLACEMENT;  Surgeon: Marcine MatarStephen Dahlstedt, MD;  Location: [facility];  Service: Urology;  Laterality: Left;    MEDICATIONS: Current Outpatient Medications on File Prior to Visit  Medication Sig Dispense Refill  . CVS VITAMIN B12 1000 MCG tablet TAKE 1 TABLET BY MOUTH EVERY DAY 30 tablet 0  . cyanocobalamin (CVS VITAMIN B12) 1000 MCG tablet Take 1 tablet (1,000 mcg total) by mouth daily. 30 tablet 1  . hydrOXYzine (VISTARIL) 25 MG capsule Take 25 mg by mouth as needed.    . IBUPROFEN PO Take by mouth.     No current facility-administered medications on file prior to visit.     ALLERGIES: No Known Allergies  FAMILY HISTORY: Family History  Problem Relation Age of Onset  . Cancer Mother [id number]       breast cancer  . Fibromyalgia Mother   . Breast cancer Mother   . Kidney Stones Father   . Anuerysm Father        renal     SOCIAL HISTORY: Social History   Socioeconomic History  . Marital status: Married    Spouse name: Not on file  . Number of children: Not on file  . Years of education: Not on file  . Highest education level: Not on file  Occupational History  .  Not on file  Social Needs  . Financial resource strain: Not on file  . Food insecurity:    Worry: Not on file    Inability: Not on file  . Transportation needs:    Medical: Not on file    Non-medical: Not on file  Tobacco Use  . Smoking status: Never Smoker  . Smokeless tobacco: Never Used  Substance and Sexual Activity  . Alcohol use: No  . Drug use: No  . Sexual activity: Yes    Birth control/protection: Condom  Lifestyle  . Physical activity:    Days per week: Not on file    Minutes per session: Not on file  . Stress: Not on file  Relationships  . Social connections:    Talks on phone: Not  on file    Gets together: Not on file    Attends religious service: Not on file    Active member of club or organization: Not on file    Attends meetings of clubs or organizations: Not on file    Relationship status: Not on file  . Intimate partner violence:    Fear of current or ex partner: Not on file    Emotionally abused: Not on file    Physically abused: Not on file    Forced sexual activity: Not on file  Other Topics Concern  . Not on file  Social History Narrative  . Not on file    REVIEW OF SYSTEMS: Constitutional: No fevers, chills, or sweats, no generalized fatigue, change in appetite Eyes: No visual changes, double vision, eye pain Ear, nose and throat: No hearing loss, ear pain, nasal congestion, sore throat Cardiovascular: No chest pain, palpitations Respiratory:  No shortness of breath at rest or with exertion, wheezes GastrointestinaI: No nausea, vomiting, diarrhea, abdominal pain, fecal incontinence Genitourinary:  No dysuria, urinary retention or frequency Musculoskeletal:  No neck pain, back pain Integumentary: No rash, pruritus, skin lesions Neurological: as above Psychiatric: No depression, insomnia, anxiety Endocrine: No palpitations, fatigue, diaphoresis, mood swings, change in appetite, change in weight, increased thirst Hematologic/Lymphatic:  No purpura, petechiae. Allergic/Immunologic: no itchy/runny eyes, nasal congestion, recent allergic reactions, rashes  PHYSICAL EXAM: Vitals:   07/10/18 0918  BP: 106/74  Pulse: 85  SpO2: 98%   General: No acute distress.  Patient appears well-groomed.  Head:  Normocephalic/atraumatic Eyes:  fundi examined but not visualized Neck: supple, no paraspinal tenderness, full range of motion Back: No paraspinal tenderness Heart: regular rate and rhythm Lungs: Clear to auscultation bilaterally. Vascular: No carotid bruits. Neurological Exam: Mental status: alert and oriented to person, place, and time, recent and  remote memory intact, fund of knowledge intact, attention and concentration intact, speech fluent and not dysarthric, language intact. Cranial nerves: CN I: not tested CN II: pupils equal, round and reactive to light, visual fields intact CN III, IV, VI:  full range of motion, no nystagmus, no ptosis CN V: facial sensation intact CN VII: upper and lower face symmetric CN VIII: hearing intact CN IX, X: gag intact, uvula midline CN XI: sternocleidomastoid and trapezius muscles intact CN XII: tongue midline Bulk & Tone: normal, no fasciculations. Motor:  5/5 throughout  Sensation:  Pinprick and vibration sensation intact. Deep Tendon Reflexes:  2+ throughout, toes downgoing.  Finger to nose testing:  Without dysmetria.  Heel to shin:  Without dysmetria.  Gait:  Normal station and stride.  Able to turn and tandem walk. Romberg negative.  IMPRESSION: Paresthesias.  Still possibly related to B12 deficiency.  She only started taking supplements at the end of May.  I typically like to re-evaluate after 6 months of supplement therapy.  PLAN: Follow up in 4 months and we can determine if further testing is warranted.  We will repeat B12 at that time.  Contact me sooner if symptoms progress and we can proceed with further testing.  Thank you for allowing me to take part in the care of this patient.  Shon Millet, DO  CC:  Hetty Blend, NP-C

## 2018-07-10 NOTE — Patient Instructions (Addendum)
B12 deficiency may still be the cause of the tingling.  I usually like to give it 6 months on supplements and then reassess.   Follow up in 4 months and we can determine if further testing is warranted.  We will repeat B12 at that time.  Contact me sooner if symptoms progress and we can proceed with testing.  Your provider has requested that you have lab work one week prior to your follow up appointment.You will need to come to our office and check in, then proceed to [facility] ([id number]) on the second floor of this building. You do not need to check in. If you are not called within 15 minutes please check with the front desk.

## 2018-07-11 MED ORDER — CYANOCOBALAMIN 1000 MCG PO TABS: 1000.0000 ug | ORAL_TABLET | Freq: Every day | ORAL | 1 refills | Status: DC

## 2018-07-11 NOTE — Telephone Encounter (Signed)
Is this okay to refill? 

## 2018-07-11 NOTE — Addendum Note (Signed)
Addended by: Herminio CommonsJOHNSON, SABRINA A on: 07/11/2018 09:24 AM   Modules accepted: Orders

## 2018-07-11 NOTE — Telephone Encounter (Signed)
Yes, thanks

## 2018-10-03 ENCOUNTER — Telehealth: Payer: Self-pay | Admitting: Neurology

## 2018-10-03 NOTE — Telephone Encounter (Signed)
Patient called and moved her appointment up sooner due to new symptoms. She also would like to know if she should come in Prior to 10/11/18 to have lab work? Please Call. Thanks

## 2018-10-03 NOTE — Telephone Encounter (Signed)
Called Pt, LMOVM advising her she can come next week and have B12

## 2018-10-04 ENCOUNTER — Other Ambulatory Visit (INDEPENDENT_AMBULATORY_CARE_PROVIDER_SITE_OTHER): Payer: BC Managed Care – PPO

## 2018-10-04 DIAGNOSIS — E538 Deficiency of other specified B group vitamins: Secondary | ICD-10-CM | POA: Diagnosis not present

## 2018-10-04 DIAGNOSIS — R202 Paresthesia of skin: Secondary | ICD-10-CM

## 2018-10-04 LAB — VITAMIN B12: VITAMIN B 12: 530 pg/mL (ref 211–911)

## 2018-10-07 ENCOUNTER — Telehealth: Payer: Self-pay

## 2018-10-07 NOTE — Telephone Encounter (Signed)
Called and advise Pt of lab results

## 2018-10-07 NOTE — Telephone Encounter (Signed)
-----   Message from Drema Dallas, DO sent at 10/04/2018  4:56 PM EDT ----- B12 is normal

## 2018-10-10 NOTE — Progress Notes (Addendum)
NEUROLOGY FOLLOW UP OFFICE NOTE  Diamond Hodges [id number]  HISTORY OF PRESENT ILLNESS: Diamond Hodges a 42 year old right-handed female with CKD, kidney stones, depression and anxiety who follows up for paresthesias.  UPDATE:  She has been on B12 supplements.  B12 level from 10/04/2018 was 530.  Numbness and tingling persists but changed.  Initially in the hands and toes, which has resolved.  Now she notes it in balls of feet when she stands.  She continues to have some paresthesias involving the face and tongue once or twice.  She also reports numbness in the genital region as well.  She also reports throbbing pain in a lymph node in the right inguinal region, which has since resolved..  She reports electric shocks in calves and right forearm.  Left calf feels cramped.  She also reports nausea beginning in February.  She had associated with abdominal pain and was diagnosed with kidney stone and constipation.  It resolved but then recently returned and is persistent.  She does reports random areas of body aches.   HISTORY:  In March, she started experiencing intermittent tingling in the fingers and toes. It would occur frequently throughout the day everyday.  Sometimes when she would sit on the floor cross-legged, she would start experiencing tingling and numbness.  One time, she was sitting on the floor with legs straight out in front of her and her entire legs from the buttocks went numb.  She may also notice it when laying down.  There is no associated pain or weakness.  At the time, she has stopped a one month trial of the keto diet.  TSH from 03/12/18 was 2.350.  Labs from 04/29/18 include Sed Rate 7, HIV negative, and B12 192.  She was started on B12 1000 mcg daily.  Since starting the supplements, symptoms have greatly improved.  She still occasionally will experience brief episodes of tingling in the extremities.  Repeat B12 from 06/10/18 was 326.  She says that she started experiencing  paresthesias a couple of years ago, but they were previously very brief and infrequent.  On about 3 or 4 occasions, she has also experienced tingling on the tip of her nose and over her eyebrows, lasting 1 to 2 hours.  She denies prior history of gait abnormality.    She reports history of BPPV, once lasting 48 hours.  She has history of migraine with aura, 2 or 3 times.  In highschool, she had episode of brief vision loss triggered by bright light.  Another time, around age 36, she had episode of seeing spots and feeling numbness and tingling followed by severe headache.  Her mother had Lyme disease and has fibromyalgia.  PAST MEDICAL HISTORY: Past Medical History:  Diagnosis Date  . Allergy    seasonal  . Anxiety   . Chronic kidney disease   . Depression   . History of vertigo   . Panic attack   . Renal calculi   . Vitamin B12 deficiency 04/30/2018    MEDICATIONS: Current Outpatient Medications on File Prior to Visit  Medication Sig Dispense Refill  . CVS VITAMIN B12 1000 MCG tablet TAKE 1 TABLET BY MOUTH EVERY DAY 30 tablet 0  . cyanocobalamin (CVS VITAMIN B12) 1000 MCG tablet Take 1 tablet (1,000 mcg total) by mouth daily. 90 tablet 1  . hydrOXYzine (VISTARIL) 25 MG capsule Take 25 mg by mouth as needed.    . IBUPROFEN PO Take by mouth.    . ondansetron (ZOFRAN) 4  MG tablet TAKE 1 TABLET 3 TIMES A DAY AS NEEDED  0   No current facility-administered medications on file prior to visit.     ALLERGIES: No Known Allergies  FAMILY HISTORY: Family History  Problem Relation Age of Onset  . Cancer Mother 2       breast cancer  . Fibromyalgia Mother   . Breast cancer Mother   . Kidney Stones Father   . Anuerysm Father        renal    SOCIAL HISTORY: Social History   Socioeconomic History  . Marital status: Married    Spouse name: Not on file  . Number of children: Not on file  . Years of education: Not on file  . Highest education level: Not on file  Occupational  History  . Not on file  Social Needs  . Financial resource strain: Not on file  . Food insecurity:    Worry: Not on file    Inability: Not on file  . Transportation needs:    Medical: Not on file    Non-medical: Not on file  Tobacco Use  . Smoking status: Never Smoker  . Smokeless tobacco: Never Used  Substance and Sexual Activity  . Alcohol use: No  . Drug use: No  . Sexual activity: Yes    Birth control/protection: Condom  Lifestyle  . Physical activity:    Days per week: Not on file    Minutes per session: Not on file  . Stress: Not on file  Relationships  . Social connections:    Talks on phone: Not on file    Gets together: Not on file    Attends religious service: Not on file    Active member of club or organization: Not on file    Attends meetings of clubs or organizations: Not on file    Relationship status: Not on file  . Intimate partner violence:    Fear of current or ex partner: Not on file    Emotionally abused: Not on file    Physically abused: Not on file    Forced sexual activity: Not on file  Other Topics Concern  . Not on file  Social History Narrative  . Not on file    REVIEW OF SYSTEMS: Constitutional: No fevers, chills, or sweats, no generalized fatigue, change in appetite Eyes: No visual changes, double vision, eye pain Ear, nose and throat: No hearing loss, ear pain, nasal congestion, sore throat Cardiovascular: No chest pain, palpitations Respiratory:  No shortness of breath at rest or with exertion, wheezes GastrointestinaI: No nausea, vomiting, diarrhea, abdominal pain, fecal incontinence Genitourinary:  No dysuria, urinary retention or frequency Musculoskeletal:  No neck pain, back pain Integumentary: No rash, pruritus, skin lesions Neurological: as above Psychiatric: No depression, insomnia, anxiety Endocrine: No palpitations, fatigue, diaphoresis, mood swings, change in appetite, change in weight, increased  thirst Hematologic/Lymphatic:  No purpura, petechiae. Allergic/Immunologic: no itchy/runny eyes, nasal congestion, recent allergic reactions, rashes  PHYSICAL EXAM: Blood pressure 114/68, height 5\' 7"  (1.702 m), weight 164 lb (74.4 kg), SpO2 95 %. General: No acute distress.  Patient appears well-groomed.   Head:  Normocephalic/atraumatic Eyes:  Fundi examined but not visualized Neck: supple, no paraspinal tenderness, full range of motion Heart:  Regular rate and rhythm Lungs:  Clear to auscultation bilaterally Back: No paraspinal tenderness Neurological Exam: alert and oriented to person, place, and time. Attention span and concentration intact, recent and remote memory intact, fund of knowledge intact.  Speech fluent  and not dysarthric, language intact.  CN II-XII intact. Bulk and tone normal, muscle strength 5/5 throughout.  Sensation to light touch, pinprick and vibration intact.  Deep tendon reflexes 2+ throughout, toes downgoing.  Finger to nose and heel to shin testing intact.  Gait normal, Romberg negative.  IMPRESSION: Paresthesias.  Pattern not consistent with peripheral neuropathy.  Not likely related to B12 deficiency at this point.  I would like to evaluate for CNS etiology, such as demyelinating disease.  PLAN: 1.  MRI of the brain with and without contrast to evaluate for demyelinating disease.  Further recommendations pending results. 2.  I would still like to check labs for ANA and Lyme 3.  Follow-up after testing.  25 minutes spent with the patient, over 50% spent discussing differential diagnosis and plan.  Shon Millet, DO  CC:  Hetty Blend, NP-C  ADDENDUM 01/03/19:  ANA and Lyme.  Negative.  MRI of brain with and without contrast from 01/03/19 personally reviewed and was essentially normal except for two tiny non-enhancing foci which are nonspecific and likely of no clinical significance.  I have no there explanation for her paresthesias.   Shon Millet,  DO

## 2018-10-11 ENCOUNTER — Ambulatory Visit: Payer: BC Managed Care – PPO | Admitting: Neurology

## 2018-10-11 ENCOUNTER — Other Ambulatory Visit (INDEPENDENT_AMBULATORY_CARE_PROVIDER_SITE_OTHER): Payer: BC Managed Care – PPO

## 2018-10-11 ENCOUNTER — Encounter: Payer: Self-pay | Admitting: Neurology

## 2018-10-11 VITALS — BP 114/68 | Ht 67.0 in | Wt 164.0 lb

## 2018-10-11 DIAGNOSIS — R202 Paresthesia of skin: Secondary | ICD-10-CM

## 2018-10-11 NOTE — Patient Instructions (Signed)
1.  We will check ANA, Lyme and MRI of brain with and without contrast 2.  Further recommendations pending results.  Follow up after testing.

## 2018-10-14 LAB — ANA: Anti Nuclear Antibody(ANA): NEGATIVE

## 2018-10-15 LAB — LYME AB/WESTERN BLOT REFLEX
LYME DISEASE AB, QUANT, IGM: <0.8 index (ref 0.00–0.79)
Lyme IgG/IgM Ab: <0.91 {ISR} (ref 0.00–0.90)

## 2018-10-16 ENCOUNTER — Telehealth: Payer: Self-pay

## 2018-10-16 NOTE — Telephone Encounter (Signed)
-----   Message from Drema Dallas, DO sent at 10/16/2018  6:19 AM EST ----- Labs are normal

## 2018-10-16 NOTE — Telephone Encounter (Signed)
Called Pt, LMOVM advising labs are normal 

## 2018-11-13 ENCOUNTER — Ambulatory Visit: Payer: BC Managed Care – PPO | Admitting: Neurology

## 2018-12-19 ENCOUNTER — Telehealth: Payer: Self-pay | Admitting: Neurology

## 2018-12-19 NOTE — Telephone Encounter (Signed)
Called and spoke with Pt. She said her insurance is not contracted with [facility], but is with [facility]. I questioned her about insurance, as I see [employer], she has the state plan. I advised her to call [facility], as far as I know, they are in network. Provided her with the number.

## 2018-12-19 NOTE — Telephone Encounter (Signed)
Patient called and would like you to call her. She has some questions regarding her MRI Scheduling and Insurance questions. Please Call. Thanks

## 2019-01-03 ENCOUNTER — Ambulatory Visit
Admission: RE | Admit: 2019-01-03 | Discharge: 2019-01-03 | Disposition: A | Discharge status: Home / Self Care | Payer: BC Managed Care – PPO | Source: Ambulatory Visit | Attending: Neurology | Admitting: Neurology

## 2019-01-03 DIAGNOSIS — R202 Paresthesia of skin: Secondary | ICD-10-CM

## 2019-01-03 MED ORDER — GADOBENATE DIMEGLUMINE 529 MG/ML IV SOLN
15.0000 mL | Freq: Once | INTRAVENOUS | Status: AC | PRN
  Administered 2019-01-03: 15 mL via INTRAVENOUS

## 2019-01-08 ENCOUNTER — Other Ambulatory Visit: Payer: Self-pay | Admitting: Obstetrics & Gynecology

## 2019-01-08 DIAGNOSIS — Z1231 Encounter for screening mammogram for malignant neoplasm of breast: Secondary | ICD-10-CM

## 2019-01-09 ENCOUNTER — Telehealth: Payer: Self-pay | Admitting: Neurology

## 2019-01-09 ENCOUNTER — Telehealth: Payer: Self-pay

## 2019-01-09 NOTE — Telephone Encounter (Signed)
Patient is calling in about recent MRI results. Please call her back. Thanks!

## 2019-01-09 NOTE — Telephone Encounter (Signed)
Called and advised Pt of MRI results 

## 2019-01-09 NOTE — Telephone Encounter (Signed)
-----   Message from Drema Dallas, DO sent at 01/03/2019 12:10 PM EST ----- MRI of brain is essentially normal.  There are two tiny spots which are nonspecific and likely of no clinical significance.  I have no explanation for the numbness and tingling other than the B12

## 2019-01-31 ENCOUNTER — Other Ambulatory Visit: Payer: Self-pay | Admitting: Family Medicine

## 2019-01-31 DIAGNOSIS — E538 Deficiency of other specified B group vitamins: Secondary | ICD-10-CM

## 2019-01-31 DIAGNOSIS — Z1231 Encounter for screening mammogram for malignant neoplasm of breast: Secondary | ICD-10-CM

## 2019-01-31 NOTE — Telephone Encounter (Signed)
Pt has had b12 checked back in October by Dr. Everlena Cooper and it was normal.

## 2019-02-06 ENCOUNTER — Ambulatory Visit
Admission: RE | Admit: 2019-02-06 | Discharge: 2019-02-06 | Disposition: A | Discharge status: Home / Self Care | Payer: BC Managed Care – PPO | Source: Ambulatory Visit

## 2019-02-06 DIAGNOSIS — Z1231 Encounter for screening mammogram for malignant neoplasm of breast: Secondary | ICD-10-CM

## 2019-09-11 ENCOUNTER — Ambulatory Visit: Payer: BC Managed Care – PPO | Admitting: Family Medicine

## 2019-10-03 ENCOUNTER — Encounter: Payer: Self-pay | Admitting: Family Medicine

## 2019-10-03 ENCOUNTER — Ambulatory Visit: Payer: BC Managed Care – PPO | Admitting: Family Medicine

## 2019-10-03 ENCOUNTER — Other Ambulatory Visit: Payer: Self-pay

## 2019-10-03 VITALS — BP 102/78 | HR 95 | Temp 98.1°F | Ht 66.5 in | Wt 174.6 lb

## 2019-10-03 DIAGNOSIS — Z23 Encounter for immunization: Secondary | ICD-10-CM

## 2019-10-03 DIAGNOSIS — E538 Deficiency of other specified B group vitamins: Secondary | ICD-10-CM | POA: Diagnosis not present

## 2019-10-03 DIAGNOSIS — R202 Paresthesia of skin: Secondary | ICD-10-CM

## 2019-10-03 DIAGNOSIS — Z0000 Encounter for general adult medical examination without abnormal findings: Secondary | ICD-10-CM | POA: Diagnosis not present

## 2019-10-03 LAB — COMPREHENSIVE METABOLIC PANEL
ALT: 18 U/L (ref 0–35)
AST: 25 U/L (ref 0–37)
Albumin: 4.7 g/dL (ref 3.5–5.2)
Alkaline Phosphatase: 54 U/L (ref 39–117)
BUN: 14 mg/dL (ref 6–23)
CO2: 26 mEq/L (ref 19–32)
Calcium: 9.9 mg/dL (ref 8.4–10.5)
Chloride: 103 mEq/L (ref 96–112)
Creatinine, Ser: 0.85 mg/dL (ref 0.40–1.20)
GFR: 73.33 mL/min (ref >60.00)
Glucose, Bld: 92 mg/dL (ref 70–99)
Potassium: 4.4 mEq/L (ref 3.5–5.1)
Sodium: 138 mEq/L (ref 135–145)
Total Bilirubin: 0.5 mg/dL (ref 0.2–1.2)
Total Protein: 7.1 g/dL (ref 6.0–8.3)

## 2019-10-03 LAB — CBC WITH DIFFERENTIAL/PLATELET
Basophils Absolute: 0.1 10*3/uL (ref 0.0–0.1)
Basophils Relative: 0.6 % (ref 0.0–3.0)
Eosinophils Absolute: 0.2 10*3/uL (ref 0.0–0.7)
Eosinophils Relative: 2.6 % (ref 0.0–5.0)
HCT: 39.4 % (ref 36.0–46.0)
Hemoglobin: 13.2 g/dL (ref 12.0–15.0)
Lymphocytes Relative: 22 % (ref 12.0–46.0)
Lymphs Abs: 2 10*3/uL (ref 0.7–4.0)
MCHC: 33.6 g/dL (ref 30.0–36.0)
MCV: 86 fl (ref 78.0–100.0)
Monocytes Absolute: 0.6 10*3/uL (ref 0.1–1.0)
Monocytes Relative: 7 % (ref 3.0–12.0)
Neutro Abs: 6.1 10*3/uL (ref 1.4–7.7)
Neutrophils Relative %: 67.8 % (ref 43.0–77.0)
Platelets: 234 10*3/uL (ref 150.0–400.0)
RBC: 4.58 Mil/uL (ref 3.87–5.11)
RDW: 14 % (ref 11.5–15.5)
WBC: 9 10*3/uL (ref 4.0–10.5)

## 2019-10-03 LAB — LIPID PANEL
Cholesterol: 193 mg/dL (ref 0–200)
HDL: 37.9 mg/dL — ABNORMAL LOW (ref >39.00)
LDL Cholesterol: 135 mg/dL — ABNORMAL HIGH (ref 0–99)
NonHDL: 154.73
Total CHOL/HDL Ratio: 5
Triglycerides: 100 mg/dL (ref 0.0–149.0)
VLDL: 20 mg/dL (ref 0.0–40.0)

## 2019-10-03 LAB — VITAMIN B12: Vitamin B-12: 604 pg/mL (ref 211–911)

## 2019-10-03 LAB — VITAMIN D 25 HYDROXY (VIT D DEFICIENCY, FRACTURES): VITD: 30.04 ng/mL (ref 30.00–100.00)

## 2019-10-03 LAB — TSH: TSH: 1.57 u[IU]/mL (ref 0.35–4.50)

## 2019-10-03 MED ORDER — FOLIC ACID 1 MG PO TABS: 1.0000 mg | ORAL_TABLET | Freq: Every day | ORAL | 1 refills | Status: DC

## 2019-10-03 MED ORDER — CYANOCOBALAMIN 1000 MCG PO TABS: 1000.0000 ug | ORAL_TABLET | Freq: Every day | ORAL | 3 refills | Status: DC

## 2019-10-03 NOTE — Progress Notes (Signed)
Patient: Diamond Hodges MRN: [id number] DOB: [birth year] PCP: Orland Mustard, MD     Subjective:  Chief Complaint  Patient presents with  . Establish Care  . Annual Exam    HPI: The patient is a 43 y.o. female who presents today for annual exam. She denies any changes to past medical history. There have been no recent hospitalizations. They are following a well balanced diet and exercise plan. Weight has been stable. No complaints today.   Family hx of breast cancer in her mother at 75 years of age. No colon cancer in family.  Hx of paraesthesia with negative work up by neuro. Still having, but does not really bother her, more annoying.    Immunization History  Administered Date(s) Administered  . Influenza,inj,Quad PF,6+ Mos 10/03/2019    Colonoscopy: 43 years of age.  Mammogram: 02/06/2019 Pap smear: 12/2016 Flu shot today Tdap: she thinks 7 years ago.    Review of Systems  Constitutional: Negative for fever.  HENT: Negative for congestion, postnasal drip, rhinorrhea and sore throat.   Eyes: Negative for visual disturbance.  Respiratory: Negative for shortness of breath.   Cardiovascular: Negative for chest pain, palpitations and leg swelling.  Gastrointestinal: Negative for abdominal pain, diarrhea, nausea and vomiting.  Endocrine: Negative for cold intolerance, heat intolerance, polydipsia and polyuria.  Genitourinary: Negative for dysuria, frequency and urgency.  Musculoskeletal: Positive for back pain and neck pain.       C/o chronic lower back pain and neck pain  Skin: Negative for rash.  Neurological: Negative for dizziness and headaches.  Psychiatric/Behavioral: Negative for sleep disturbance.    Allergies Patient has No Known Allergies.  Past Medical History Patient  has a past medical history of Allergy, Anxiety, Chronic kidney disease, Depression, History of vertigo, Panic attack, Renal calculi, and Vitamin B12 deficiency (04/30/2018).  Surgical  History Patient  has a past surgical history that includes Cesarean section and Cystoscopy w/ ureteral stent placement (Left, 04/29/2015).  Family History Pateint's family history includes Anuerysm in her father; Breast cancer in her mother; Cancer (age of onset: 72) in her mother; Fibromyalgia in her mother; Kidney Stones in her father.  Social History Patient  reports that she has never smoked. She has never used smokeless tobacco. She reports that she does not drink alcohol or use drugs.    Objective: Vitals:   10/03/19 1338  BP: 102/78  Pulse: 95  Temp: 98.1 F (36.7 C)  TempSrc: Skin  SpO2: 96%  Weight: 174 lb 9.6 oz (79.2 kg)  Height: 5' 6.5" (1.689 m)    Body mass index is 27.76 kg/m.  Physical Exam Vitals signs reviewed.  Constitutional:      Appearance: Normal appearance. She is well-developed and normal weight.  HENT:     Head: Normocephalic and atraumatic.     Right Ear: Tympanic membrane, ear canal and external ear normal.     Left Ear: Tympanic membrane, ear canal and external ear normal.     Nose: Nose normal.     Mouth/Throat:     Mouth: Mucous membranes are moist.  Eyes:     Extraocular Movements: Extraocular movements intact.     Conjunctiva/sclera: Conjunctivae normal.     Pupils: Pupils are equal, round, and reactive to light.  Neck:     Musculoskeletal: Normal range of motion and neck supple.     Thyroid: No thyromegaly.  Cardiovascular:     Rate and Rhythm: Normal rate and regular rhythm.     Pulses:  Normal pulses.     Heart sounds: Normal heart sounds. No murmur.  Pulmonary:     Effort: Pulmonary effort is normal.     Breath sounds: Normal breath sounds.  Abdominal:     General: Abdomen is flat. Bowel sounds are normal. There is no distension.     Palpations: Abdomen is soft.     Tenderness: There is no abdominal tenderness.  Musculoskeletal: Normal range of motion.  Lymphadenopathy:     Cervical: No cervical adenopathy.  Skin:     General: Skin is warm and dry.     Capillary Refill: Capillary refill takes less than 2 seconds.     Findings: No rash.  Neurological:     General: No focal deficit present.     Mental Status: She is alert and oriented to person, place, and time.     Cranial Nerves: No cranial nerve deficit.     Sensory: No sensory deficit.     Motor: No weakness.     Coordination: Coordination normal.     Gait: Gait normal.     Deep Tendon Reflexes: Reflexes normal.  Psychiatric:        Behavior: Behavior normal.    Depression screen [facility] 2/9 10/03/2019 03/12/2018 02/14/2016  Decreased Interest 0 0 2  Down, Depressed, Hopeless 0 0 2  PHQ - 2 Score 0 0 4  Altered sleeping 0 - 2  Tired, decreased energy 0 - 2  Change in appetite 0 - 2  Feeling bad or failure about yourself  0 - 1  Trouble concentrating 0 - 1  Moving slowly or fidgety/restless 0 - 0  Suicidal thoughts 0 - 0  PHQ-9 Score 0 - 12  Difficult doing work/chores Not difficult at all - Somewhat difficult   GAD 7 : Generalized Anxiety Score 10/03/2019  Nervous, Anxious, on Edge 0  Control/stop worrying 0  Worry too much - different things 0  Trouble relaxing 0  Restless 0  Easily annoyed or irritable 1  Afraid - awful might happen 0  Total GAD 7 Score 1  Anxiety Difficulty Not difficult at all        Assessment/plan: 1. Annual physical exam Routine lab work today. She is not fasting. Flu shot today. UTD on HM, not sure she has her tdap records, but she knows she is up to date.  Continue healthy diet/exercise. F/u in one year or as needed.  Patient counseling [x]    Nutrition: Stressed importance of moderation in sodium/caffeine intake, saturated fat and cholesterol, caloric balance, sufficient intake of fresh fruits, vegetables, fiber, calcium, iron, and 1 mg of folate supplement per day (for females capable of pregnancy).  [x]    Stressed the importance of regular exercise.   []    Substance Abuse: Discussed cessation/primary  prevention of tobacco, alcohol, or other drug use; driving or other dangerous activities under the influence; availability of treatment for abuse.   [x]    Injury prevention: Discussed safety belts, safety helmets, smoke detector, smoking near bedding or upholstery.   [x]    Sexuality: Discussed sexually transmitted diseases, partner selection, use of condoms, avoidance of unintended pregnancy  and contraceptive alternatives.  [x]    Dental health: Discussed importance of regular tooth brushing, flossing, and dental visits.  [x]    Health maintenance and immunizations reviewed. Please refer to Health maintenance section.    - CBC with Differential/Platelet - Comprehensive metabolic panel - Lipid panel - VITAMIN D 25 Hydroxy (Vit-D Deficiency, Fractures) - TSH  2. Vitamin B12 deficiency  -  Vitamin B12 - cyanocobalamin ([facility] VITAMIN B12) 1000 MCG tablet; Take 1 tablet (1,000 mcg total) by mouth daily.  Dispense: 90 tablet; Refill: 3  3. Paresthesias -extensive work up with neuro. Checking ANA again, her theory of fibro is not out of the question but is not ready to start any medication. Will do trial of folic acid as well. F/u as needed.     Return in about 1 year (around 10/02/2020).  Orland MustardAllison Corayma Cashatt, MD [facility]  10/03/2019

## 2019-10-03 NOTE — Patient Instructions (Signed)
Try folic acid daily for paraesthesias, if no change you can just stop this.  Refilled your B12 and checking ANA again.   You look great!    Preventive Care 32-42 Years Old, Female Preventive care refers to visits with your health care provider and lifestyle choices that can promote health and wellness. This includes:  A yearly physical exam. This may also be called an annual well check.  Regular dental visits and eye exams.  Immunizations.  Screening for certain conditions.  Healthy lifestyle choices, such as eating a healthy diet, getting regular exercise, not using drugs or products that contain nicotine and tobacco, and limiting alcohol use. What can I expect for my preventive care visit? Physical exam Your health care provider will check your:  Height and weight. This may be used to calculate body mass index (BMI), which tells if you are at a healthy weight.  Heart rate and blood pressure.  Skin for abnormal spots. Counseling Your health care provider may ask you questions about your:  Alcohol, tobacco, and drug use.  Emotional well-being.  Home and relationship well-being.  Sexual activity.  Eating habits.  Work and work Statistician.  Method of birth control.  Menstrual cycle.  Pregnancy history. What immunizations do I need?  Influenza (flu) vaccine  This is recommended every year. Tetanus, diphtheria, and pertussis (Tdap) vaccine  You may need a Td booster every 10 years. Varicella (chickenpox) vaccine  You may need this if you have not been vaccinated. Zoster (shingles) vaccine  You may need this after age 14. Measles, mumps, and rubella (MMR) vaccine  You may need at least one dose of MMR if you were born in 1957 or later. You may also need a second dose. Pneumococcal conjugate (PCV13) vaccine  You may need this if you have certain conditions and were not previously vaccinated. Pneumococcal polysaccharide (PPSV23) vaccine  You may need  one or two doses if you smoke cigarettes or if you have certain conditions. Meningococcal conjugate (MenACWY) vaccine  You may need this if you have certain conditions. Hepatitis A vaccine  You may need this if you have certain conditions or if you travel or work in places where you may be exposed to hepatitis A. Hepatitis B vaccine  You may need this if you have certain conditions or if you travel or work in places where you may be exposed to hepatitis B. Haemophilus influenzae type b (Hib) vaccine  You may need this if you have certain conditions. Human papillomavirus (HPV) vaccine  If recommended by your health care provider, you may need three doses over 6 months. You may receive vaccines as individual doses or as more than one vaccine together in one shot (combination vaccines). Talk with your health care provider about the risks and benefits of combination vaccines. What tests do I need? Blood tests  Lipid and cholesterol levels. These may be checked every 5 years, or more frequently if you are over 21 years old.  Hepatitis C test.  Hepatitis B test. Screening  Lung cancer screening. You may have this screening every year starting at age 37 if you have a 30-pack-year history of smoking and currently smoke or have quit within the past 15 years.  Colorectal cancer screening. All adults should have this screening starting at age 52 and continuing until age 59. Your health care provider may recommend screening at age 91 if you are at increased risk. You will have tests every 1-10 years, depending on your results and  the type of screening test.  Diabetes screening. This is done by checking your blood sugar (glucose) after you have not eaten for a while (fasting). You may have this done every 1-3 years.  Mammogram. This may be done every 1-2 years. Talk with your health care provider about when you should start having regular mammograms. This may depend on whether you have a family  history of breast cancer.  BRCA-related cancer screening. This may be done if you have a family history of breast, ovarian, tubal, or peritoneal cancers.  Pelvic exam and Pap test. This may be done every 3 years starting at age 85. Starting at age 24, this may be done every 5 years if you have a Pap test in combination with an HPV test. Other tests  Sexually transmitted disease (STD) testing.  Bone density scan. This is done to screen for osteoporosis. You may have this scan if you are at high risk for osteoporosis. Follow these instructions at home: Eating and drinking  Eat a diet that includes fresh fruits and vegetables, whole grains, lean protein, and low-fat dairy.  Take vitamin and mineral supplements as recommended by your health care provider.  Do not drink alcohol if: ? Your health care provider tells you not to drink. ? You are pregnant, may be pregnant, or are planning to become pregnant.  If you drink alcohol: ? Limit how much you have to 0-1 drink a day. ? Be aware of how much alcohol is in your drink. In the [place] one drink equals one 12 oz bottle of beer (355 mL), one 5 oz glass of wine (148 mL), or one 1 oz glass of hard liquor (44 mL). Lifestyle  Take daily care of your teeth and gums.  Stay active. Exercise for at least 30 minutes on 5 or more days each week.  Do not use any products that contain nicotine or tobacco, such as cigarettes, e-cigarettes, and chewing tobacco. If you need help quitting, ask your health care provider.  If you are sexually active, practice safe sex. Use a condom or other form of birth control (contraception) in order to prevent pregnancy and STIs (sexually transmitted infections).  If told by your health care provider, take low-dose aspirin daily starting at age 6. What's next?  Visit your health care provider once a year for a well check visit.  Ask your health care provider how often you should have your eyes and teeth checked.   Stay up to date on all vaccines. This information is not intended to replace advice given to you by your health care provider. Make sure you discuss any questions you have with your health care provider. Document Released: 12/24/2015 Document Revised: 08/08/2018 Document Reviewed: 08/08/2018 Elsevier Patient Education  2020 [employer].

## 2019-10-06 LAB — ANA: Anti Nuclear Antibody (ANA): NEGATIVE

## 2020-01-16 ENCOUNTER — Encounter: Payer: Self-pay | Admitting: Family Medicine

## 2020-01-20 ENCOUNTER — Encounter: Payer: Self-pay | Admitting: Obstetrics and Gynecology

## 2020-01-27 ENCOUNTER — Other Ambulatory Visit: Payer: Self-pay

## 2020-01-29 ENCOUNTER — Encounter: Payer: BC Managed Care – PPO | Admitting: Obstetrics and Gynecology

## 2020-01-29 ENCOUNTER — Encounter: Payer: Self-pay | Admitting: Family Medicine

## 2020-01-29 NOTE — Telephone Encounter (Signed)
Lea, can you look into this please.

## 2020-02-02 ENCOUNTER — Encounter: Payer: Self-pay | Admitting: Obstetrics and Gynecology

## 2020-02-02 ENCOUNTER — Ambulatory Visit (INDEPENDENT_AMBULATORY_CARE_PROVIDER_SITE_OTHER): Payer: BC Managed Care – PPO | Admitting: Obstetrics and Gynecology

## 2020-02-02 ENCOUNTER — Other Ambulatory Visit: Payer: Self-pay

## 2020-02-02 VITALS — BP 122/62 | HR 97 | Temp 98.3°F | Ht 67.0 in | Wt 180.0 lb

## 2020-02-02 DIAGNOSIS — Z01419 Encounter for gynecological examination (general) (routine) without abnormal findings: Secondary | ICD-10-CM

## 2020-02-02 DIAGNOSIS — Z803 Family history of malignant neoplasm of breast: Secondary | ICD-10-CM

## 2020-02-02 DIAGNOSIS — N393 Stress incontinence (female) (male): Secondary | ICD-10-CM | POA: Diagnosis not present

## 2020-02-02 NOTE — Patient Instructions (Addendum)
EXERCISE AND DIET:  We recommended that you start or continue a regular exercise program for good health. Regular exercise means any activity that makes your heart beat faster and makes you sweat.  We recommend exercising at least 30 minutes per day at least 3 days a week, preferably 4 or 5.  We also recommend a diet low in fat and sugar.  Inactivity, poor dietary choices and obesity can cause diabetes, heart attack, stroke, and kidney damage, among others.    ALCOHOL AND SMOKING:  Women should limit their alcohol intake to no more than 7 drinks/beers/glasses of wine (combined, not each!) per week. Moderation of alcohol intake to this level decreases your risk of breast cancer and liver damage. And of course, no recreational drugs are part of a healthy lifestyle.  And absolutely no smoking or even second hand smoke. Most people know smoking can cause heart and lung diseases, but did you know it also contributes to weakening of your bones? Aging of your skin?  Yellowing of your teeth and nails?  CALCIUM AND VITAMIN D:  Adequate intake of calcium and Vitamin D are recommended.  The recommendations for exact amounts of these supplements seem to change often, but generally speaking 1,000 mg of calcium (between diet and supplement) and 800 units of Vitamin D per day seems prudent. Certain women may benefit from higher intake of Vitamin D.  If you are among these women, your doctor will have told you during your visit.    PAP SMEARS:  Pap smears, to check for cervical cancer or precancers,  have traditionally been done yearly, although recent scientific advances have shown that most women can have pap smears less often.  However, every woman still should have a physical exam from her gynecologist every year. It will include a breast check, inspection of the vulva and vagina to check for abnormal growths or skin changes, a visual exam of the cervix, and then an exam to evaluate the size and shape of the uterus and  ovaries.  And after 43 years of age, a rectal exam is indicated to check for rectal cancers. We will also provide age appropriate advice regarding health maintenance, like when you should have certain vaccines, screening for sexually transmitted diseases, bone density testing, colonoscopy, mammograms, etc.   MAMMOGRAMS:  All women over 40 years old should have a yearly mammogram. Many facilities now offer a "3D" mammogram, which may cost around $50 extra out of pocket. If possible,  we recommend you accept the option to have the 3D mammogram performed.  It both reduces the number of women who will be called back for extra views which then turn out to be normal, and it is better than the routine mammogram at detecting truly abnormal areas.    COLON CANCER SCREENING: Now recommend starting at age 45. At this time colonoscopy is not covered for routine screening until 50. There are take home tests that can be done between 45-49.   COLONOSCOPY:  Colonoscopy to screen for colon cancer is recommended for all women at age 50.  We know, you hate the idea of the prep.  We agree, BUT, having colon cancer and not knowing it is worse!!  Colon cancer so often starts as a polyp that can be seen and removed at colonscopy, which can quite literally save your life!  And if your first colonoscopy is normal and you have no family history of colon cancer, most women don't have to have it again for   10 years.  Once every ten years, you can do something that may end up saving your life, right?  We will be happy to help you get it scheduled when you are ready.  Be sure to check your insurance coverage so you understand how much it will cost.  It may be covered as a preventative service at no cost, but you should check your particular policy.      Breast Self-Awareness Breast self-awareness means being familiar with how your breasts look and feel. It involves checking your breasts regularly and reporting any changes to your  health care provider. Practicing breast self-awareness is important. A change in your breasts can be a sign of a serious medical problem. Being familiar with how your breasts look and feel allows you to find any problems early, when treatment is more likely to be successful. All women should practice breast self-awareness, including women who have had breast implants. How to do a breast self-exam One way to learn what is normal for your breasts and whether your breasts are changing is to do a breast self-exam. To do a breast self-exam: Look for Changes  1. Remove all the clothing above your waist. 2. Stand in front of a mirror in a room with good lighting. 3. Put your hands on your hips. 4. Push your hands firmly downward. 5. Compare your breasts in the mirror. Look for differences between them (asymmetry), such as: ? Differences in shape. ? Differences in size. ? Puckers, dips, and bumps in one breast and not the other. 6. Look at each breast for changes in your skin, such as: ? Redness. ? Scaly areas. 7. Look for changes in your nipples, such as: ? Discharge. ? Bleeding. ? Dimpling. ? Redness. ? A change in position. Feel for Changes Carefully feel your breasts for lumps and changes. It is best to do this while lying on your back on the floor and again while sitting or standing in the shower or tub with soapy water on your skin. Feel each breast in the following way:  Place the arm on the side of the breast you are examining above your head.  Feel your breast with the other hand.  Start in the nipple area and make  inch (2 cm) overlapping circles to feel your breast. Use the pads of your three middle fingers to do this. Apply light pressure, then medium pressure, then firm pressure. The light pressure will allow you to feel the tissue closest to the skin. The medium pressure will allow you to feel the tissue that is a little deeper. The firm pressure will allow you to feel the tissue  close to the ribs.  Continue the overlapping circles, moving downward over the breast until you feel your ribs below your breast.  Move one finger-width toward the center of the body. Continue to use the  inch (2 cm) overlapping circles to feel your breast as you move slowly up toward your collarbone.  Continue the up and down exam using all three pressures until you reach your armpit.  Write Down What You Find  Write down what is normal for each breast and any changes that you find. Keep a written record with breast changes or normal findings for each breast. By writing this information down, you do not need to depend only on memory for size, tenderness, or location. Write down where you are in your menstrual cycle, if you are still menstruating. If you are having trouble noticing differences   in your breasts, do not get discouraged. With time you will become more familiar with the variations in your breasts and more comfortable with the exam. How often should I examine my breasts? Examine your breasts every month. If you are breastfeeding, the best time to examine your breasts is after a feeding or after using a breast pump. If you menstruate, the best time to examine your breasts is 5-7 days after your period is over. During your period, your breasts are lumpier, and it may be more difficult to notice changes. When should I see my health care provider? See your health care provider if you notice:  A change in shape or size of your breasts or nipples.  A change in the skin of your breast or nipples, such as a reddened or scaly area.  Unusual discharge from your nipples.  A lump or thick area that was not there before.  Pain in your breasts.  Anything that concerns you.  Kegel Exercises  Kegel exercises can help strengthen your pelvic floor muscles. The pelvic floor is a group of muscles that support your rectum, small intestine, and bladder. In females, pelvic floor muscles also help  support the womb (uterus). These muscles help you control the flow of urine and stool. Kegel exercises are painless and simple, and they do not require any equipment. Your provider may suggest Kegel exercises to:  Improve bladder and bowel control.  Improve sexual response.  Improve weak pelvic floor muscles after surgery to remove the uterus (hysterectomy) or pregnancy (females).  Improve weak pelvic floor muscles after prostate gland removal or surgery (males). Kegel exercises involve squeezing your pelvic floor muscles, which are the same muscles you squeeze when you try to stop the flow of urine or keep from passing gas. The exercises can be done while sitting, standing, or lying down, but it is best to vary your position. Exercises How to do Kegel exercises: 1. Squeeze your pelvic floor muscles tight. You should feel a tight lift in your rectal area. If you are a female, you should also feel a tightness in your vaginal area. Keep your stomach, buttocks, and legs relaxed. 2. Hold the muscles tight for up to 10 seconds. 3. Breathe normally. 4. Relax your muscles. 5. Repeat as told by your health care provider. Repeat this exercise daily as told by your health care provider. Continue to do this exercise for at least 4-6 weeks, or for as long as told by your health care provider. You may be referred to a physical therapist who can help you learn more about how to do Kegel exercises. Depending on your condition, your health care provider may recommend:  Varying how long you squeeze your muscles.  Doing several sets of exercises every day.  Doing exercises for several weeks.  Making Kegel exercises a part of your regular exercise routine. This information is not intended to replace advice given to you by your health care provider. Make sure you discuss any questions you have with your health care provider. Document Revised: 07/17/2018 Document Reviewed: 07/17/2018 Elsevier Patient  Education  2020 [employer]   Urinary Incontinence  Urinary incontinence refers to a condition in which a person is unable to control where and when to pass urine. A person with this condition will urinate when he or she does not mean to (involuntarily). What are the causes? This condition may be caused by:  Medicines.  Infections.  Constipation.  Overactive bladder muscles.  Weak bladder muscles.  Weak pelvic floor muscles. These muscles provide support for the bladder, intestine, and, in women, the uterus.  Enlarged prostate in men. The prostate is a gland near the bladder. When it gets too big, it can pinch the urethra. With the urethra blocked, the bladder can weaken and lose the ability to empty properly.  Surgery.  Emotional factors, such as anxiety, stress, or post-traumatic stress disorder (PTSD).  Pelvic organ prolapse. This happens in women when organs shift out of place and into the vagina. This shift can prevent the bladder and urethra from working properly. What increases the risk? The following factors may make you more likely to develop this condition:  Older age.  Obesity and physical inactivity.  Pregnancy and childbirth.  Menopause.  Diseases that affect the nerves or spinal cord (neurological diseases).  Long-term (chronic) coughing. This can increase pressure on the bladder and pelvic floor muscles. What are the signs or symptoms? Symptoms may vary depending on the type of urinary incontinence you have. They include:  A sudden urge to urinate, but passing urine involuntarily before you can get to a bathroom (urge incontinence).  Suddenly passing urine with any activity that forces urine to pass, such as coughing, laughing, exercise, or sneezing (stress incontinence).  Needing to urinate often, but urinating only a small amount, or constantly dribbling urine (overflow incontinence).  Urinating because you cannot get to the bathroom in time due  to a physical disability, such as arthritis or injury, or communication and thinking problems, such as Alzheimer disease (functional incontinence). How is this diagnosed? This condition may be diagnosed based on:  Your medical history.  A physical exam.  Tests, such as: ? Urine tests. ? X-rays of your kidney and bladder. ? Ultrasound. ? CT scan. ? Cystoscopy. In this procedure, a health care provider inserts a tube with a light and camera (cystoscope) through the urethra and into the bladder in order to check for problems. ? Urodynamic testing. These tests assess how well the bladder, urethra, and sphincter can store and release urine. There are different types of urodynamic tests, and they vary depending on what the test is measuring. To help diagnose your condition, your health care provider may recommend that you keep a log of when you urinate and how much you urinate. How is this treated? Treatment for this condition depends on the type of incontinence that you have and its cause. Treatment may include:  Lifestyle changes, such as: ? Quitting smoking. ? Maintaining a healthy weight. ? Staying active. Try to get 150 minutes of moderate-intensity exercise every week. Ask your health care provider which activities are safe for you. ? Eating a healthy diet.  Avoid high-fat foods, like fried foods.  Avoid refined carbohydrates like white bread and white rice.  Limit how much alcohol and caffeine you drink.  Increase your fiber intake. Foods such as fresh fruits, vegetables, beans, and whole grains are healthy sources of fiber.  Pelvic floor muscle exercises.  Bladder training, such as lengthening the amount of time between bathroom breaks, or using the bathroom at regular intervals.  Using techniques to suppress bladder urges. This can include distraction techniques or controlled breathing exercises.  Medicines to relax the bladder muscles and prevent bladder spasms.   Medicines to help slow or prevent the growth of a man's prostate.  Botox injections. These can help relax the bladder muscles.  Using pulses of electricity to help change bladder reflexes (electrical nerve stimulation).  For women, using a medical device  to prevent urine leaks. This is a small, tampon-like, disposable device that is inserted into the urethra.  Injecting collagen or carbon beads (bulking agents) into the urinary sphincter. These can help thicken tissue and close the bladder opening.  Surgery. Follow these instructions at home: Lifestyle  Limit alcohol and caffeine. These can fill your bladder quickly and irritate it.  Keep yourself clean to help prevent odors and skin damage. Ask your doctor about special skin creams and cleansers that can protect the skin from urine.  Consider wearing pads or adult diapers. Make sure to change them regularly, and always change them right after experiencing incontinence. General instructions  Take over-the-counter and prescription medicines only as told by your health care provider.  Use the bathroom about every 3-4 hours, even if you do not feel the need to urinate. Try to empty your bladder completely every time. After urinating, wait a minute. Then try to urinate again.  Make sure you are in a relaxed position while urinating.  If your incontinence is caused by nerve problems, keep a log of the medicines you take and the times you go to the bathroom.  Keep all follow-up visits as told by your health care provider. This is important. Contact a health care provider if:  You have pain that gets worse.  Your incontinence gets worse. Get help right away if:  You have a fever or chills.  You are unable to urinate.  You have redness in your groin area or down your legs. Summary  Urinary incontinence refers to a condition in which a person is unable to control where and when to pass urine.  This condition may be caused by  medicines, infection, weak bladder muscles, weak pelvic floor muscles, enlargement of the prostate (in men), or surgery.  The following factors increase your risk for developing this condition: older age, obesity, pregnancy and childbirth, menopause, neurological diseases, and chronic coughing.  There are several types of urinary incontinence. They include urge incontinence, stress incontinence, overflow incontinence, and functional incontinence.  This condition is usually treated first with lifestyle and behavioral changes, such as quitting smoking, eating a healthier diet, and doing regular pelvic floor exercises. Other treatment options include medicines, bulking agents, medical devices, electrical nerve stimulation, or surgery. This information is not intended to replace advice given to you by your health care provider. Make sure you discuss any questions you have with your health care provider. Document Revised: 12/07/2017 Document Reviewed: 03/08/2017 Elsevier Patient Education  2020 [employer].

## 2020-02-02 NOTE — Progress Notes (Signed)
44 y.o. [id number] Married White or Caucasian Not Hispanic or Latino female here for annual exam and establish care.  She does c/o intermittent ovulatory pain. She has had 2 episodes of pain in the last few months that feels lower than her typical ovulatory pain only on the right and lasted for 12 hours. The pain was sharp/aching, but mild.    Period Cycle (Days): 27 Period Duration (Days): 4 Period Pattern: Regular Menstrual Flow: Light, Moderate Menstrual Control: Tampon, Maxi pad, Thin pad, Panty liner Menstrual Control Change Freq (Hours): 4 Dysmenorrhea: (!) Mild Dysmenorrhea Symptoms: Headache, Cramping(Past 6 months she has been getting a headache on the right side right before her period starts.)  Vasectomy for contraception, done in 12/20. He has post vasectomy pain syndrome. He is starting to feel better. They are using condoms.  No dyspareunia.   Patient's last menstrual period was 01/13/2020 (approximate).          Sexually active: Yes.    The current method of family planning is vasectomy.    Exercising: Yes.    walking, weights and yoga Smoker:  no  Health Maintenance: Pap:  01/01/17 normal, negative HPV  History of abnormal Pap:  No  MMG:  02/06/19 Density d Bi-rads 1 neg  TDaP:  2013 Gardasil: NA   reports that she has never smoked. She has never used smokeless tobacco. She reports that she does not drink alcohol or use drugs. Rare ETOH. Kids are 53 and 20 (both girls). Oldest is living local and working. 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   Past Medical History:  Diagnosis Date  . Allergy    seasonal  . Anxiety   . Chronic kidney disease   . Depression   . History of vertigo   . Migraines   . Panic attack   . Renal calculi   . Urine incontinence   . Vitamin B12 deficiency 04/30/2018    Past Surgical History:  Procedure Laterality Date  . CESAREAN SECTION    . CYSTOSCOPY W/ URETERAL STENT PLACEMENT Left 04/29/2015   Procedure: CYSTOSCOPY WITH RETROGRADE  PYELOGRAM/LEFT URETEROSCOPY/STONE BASKETRY/ LEFT URETERAL STENT PLACEMENT;  Surgeon: Marcine Matar, MD;  Location: [facility];  Service: Urology;  Laterality: Left;  2nd daughter was a c/s  Current Outpatient Medications  Medication Sig Dispense Refill  . co-enzyme Q-10 30 MG capsule Take 100 mg by mouth 3 (three) times daily.    . cyanocobalamin ([facility] VITAMIN B12) 1000 MCG tablet Take 1 tablet (1,000 mcg total) by mouth daily. 90 tablet 3  . Fish Oil-Cholecalciferol (FISH OIL-VITAMIN D) 1200-1000 MG-UNIT CAPS Take by mouth.    . folic acid (FOLVITE) 1 MG tablet Take 1 tablet (1 mg total) by mouth daily. 90 tablet 1  . IBUPROFEN PO Take by mouth.    . magnesium oxide (MAG-OX) 400 MG tablet Take 400 mg by mouth daily.     No current facility-administered medications for this visit.    Family History  Problem Relation Age of Onset  . Fibromyalgia Mother   . Breast cancer Mother 43  . Depression Mother   . Hypertension Mother   . Miscarriages / [place] Mother   . Kidney Stones Father   . Anuerysm Father        renal   . Alcohol abuse Father   . Drug abuse Brother     Review of Systems  Constitutional: Negative.   HENT: Negative.   Eyes: Negative.   Respiratory: Negative.   Cardiovascular: Negative.  Gastrointestinal: Negative.   Endocrine: Negative.   Genitourinary: Negative.   Musculoskeletal: Negative.   Skin: Negative.   Allergic/Immunologic: Negative.   Neurological: Negative.   Hematological: Negative.   Psychiatric/Behavioral: Negative.   All other systems reviewed and are negative. She c/o GSI, she typically leaks several times unless she has a cold. Typically leaks small amounts. She isn't wearing a pad regularly.  She has some tingling sensations throughout her body, saw a Neurologist r/o lyme, had a normal brain MRI. Normal ANA. Symptoms aren't worsening. She is watching what she eats.    Exam:   BP 122/62   Pulse 97   Temp 98.3 F (36.8 C)   Ht 5\' 7"   (1.702 m)   Wt 180 lb (81.6 kg)   LMP 01/13/2020 (Approximate)   SpO2 95%   BMI 28.19 kg/m   Weight change: @WEIGHTCHANGE @ Height:   Height: 5\' 7"  (170.2 cm)  Ht Readings from Last 3 Encounters:  02/02/20 5\' 7"  (1.702 m)  10/03/19 5' 6.5" (1.689 m)  10/11/18 5\' 7"  (1.702 m)    General appearance: alert, cooperative and appears stated age Head: Normocephalic, without obvious abnormality, atraumatic Neck: no adenopathy, supple, symmetrical, trachea midline and thyroid normal to inspection and palpation Lungs: clear to auscultation bilaterally Cardiovascular: regular rate and rhythm Breasts: normal appearance, no masses or tenderness Abdomen: soft, non-tender; non distended,  no masses,  no organomegaly Extremities: extremities normal, atraumatic, no cyanosis or edema Skin: Skin color, texture, turgor normal. No rashes or lesions Lymph nodes: Cervical, supraclavicular, and axillary nodes normal. No abnormal inguinal nodes palpated Neurologic: Grossly normal   Pelvic: External genitalia:  no lesions              Urethra:  normal appearing urethra with no masses, tenderness or lesions              Bartholins and Skenes: normal                 Vagina: normal appearing vagina with normal color and discharge, no lesions              Cervix: no lesions               Bimanual Exam:  Uterus:  normal size, contour, position, consistency, mobility, non-tender and anteverted              Adnexa: no mass, fullness, tenderness               Rectovaginal: Confirms               Anus:  normal sphincter tone, no lesions  Gae Dry chaperoned for the exam.  A:  Well Woman with normal exam  Mom with breast cancer at 48, not sure if she was pre or postmenopausal, not sure about genetic testing  P:   No pap this year  Discussed breast self exam  Discussed calcium and vit D intake  She will check on the details of her Mom's breast cancer.   Labs with primary  Mammogram, she will  schedule

## 2020-02-03 NOTE — Telephone Encounter (Signed)
I have spoken with patient in regard.  Following back up with Dawn in regard.

## 2020-02-05 NOTE — Telephone Encounter (Signed)
Please see patient message dated 48/5.

## 2020-02-05 NOTE — Telephone Encounter (Signed)
I have followed back up in regard with Diamond Hodges.  There is no other coding that can be used.  I have informed the patient.

## 2020-02-19 ENCOUNTER — Other Ambulatory Visit: Payer: Self-pay | Admitting: Obstetrics and Gynecology

## 2020-02-19 DIAGNOSIS — Z1231 Encounter for screening mammogram for malignant neoplasm of breast: Secondary | ICD-10-CM

## 2020-02-24 ENCOUNTER — Ambulatory Visit
Admission: RE | Admit: 2020-02-24 | Discharge: 2020-02-24 | Disposition: A | Discharge status: Home / Self Care | Payer: BC Managed Care – PPO | Source: Ambulatory Visit

## 2020-02-24 ENCOUNTER — Other Ambulatory Visit: Payer: Self-pay

## 2020-02-24 DIAGNOSIS — Z1231 Encounter for screening mammogram for malignant neoplasm of breast: Secondary | ICD-10-CM

## 2020-03-29 ENCOUNTER — Other Ambulatory Visit: Payer: Self-pay | Admitting: Family Medicine

## 2020-05-18 ENCOUNTER — Other Ambulatory Visit: Payer: Self-pay

## 2020-05-19 ENCOUNTER — Ambulatory Visit: Payer: BC Managed Care – PPO | Admitting: Family Medicine

## 2020-05-19 ENCOUNTER — Encounter: Payer: Self-pay | Admitting: Family Medicine

## 2020-05-19 VITALS — BP 100/68 | HR 84 | Temp 98.3°F | Ht 68.0 in | Wt 171.6 lb

## 2020-05-19 DIAGNOSIS — M546 Pain in thoracic spine: Secondary | ICD-10-CM | POA: Diagnosis not present

## 2020-05-19 MED ORDER — BACLOFEN 10 MG PO TABS: 10.0000 mg | ORAL_TABLET | Freq: Three times a day (TID) | ORAL | 0 refills | Status: DC

## 2020-05-19 NOTE — Patient Instructions (Signed)
-  continue stretching -heating pad at night -? If you have strained your lat muscle -baclofen muscle relaxer I sent in that you can take up to three times a day.   -make sure not correlated with food (sometimes gallbladder presents with back pain, but your physical test for this was negative)  Sounds like you have reflux, may need a tums/pepcid or prevacid before bed. Stress could be making this worse.   If not getting better, email me or call next week and let's get an xray.   Have a wonderful time at the beach.   Aw

## 2020-05-19 NOTE — Progress Notes (Signed)
Patient: Diamond Hodges MRN: [id number] DOB: September 29, 1976 PCP: Orma Flaming, MD     Subjective:  Chief Complaint  Patient presents with  . Back Pain    rt sided; Noticed 1 week ago.    HPI: The patient is a 44 y.o. female who presents today for right sided back pain that she noticed about 1 week ago. No abdominal pain, SOB or painful urination reported. History of nephrolithiasis on the left. She states this does not feel like a kidney stone. Pain is located on her right upper back and right lateral side. She doesn't feel anything when she presses on it. Pain is worse when she is lying down. She had some nausea for 2-3 days and this is better. She also has some headaches with lower neck pain.   Back pain is intermittent and she doesn't feel anything right now, but if laying down it's a 5-6/10. It is dull and achy in nature. May radiate slightly upward. No pain with deep inhalation. No pain with twisting motion. No pain with bending over. She doesn't think worse with food.    Review of Systems  Respiratory: Negative for cough, shortness of breath and wheezing.   Cardiovascular: Negative for chest pain and palpitations.  Gastrointestinal: Negative for abdominal pain, nausea and vomiting.  Genitourinary: Negative for frequency, pelvic pain and urgency.  Musculoskeletal: Positive for back pain. Negative for gait problem.  Neurological: Positive for headaches. Negative for dizziness and light-headedness.    Allergies Patient has No Known Allergies.  Past Medical History Patient  has a past medical history of Allergy, Anxiety, Chronic kidney disease, Depression, History of vertigo, Migraines, Panic attack, Renal calculi, Urine incontinence, and Vitamin B12 deficiency (04/30/2018).  Surgical History Patient  has a past surgical history that includes Cesarean section and Cystoscopy w/ ureteral stent placement (Left, 04/29/2015).  Family History Pateint's family history includes Alcohol  abuse in her father; Anuerysm in her father; Breast cancer (age of onset: 41) in her mother; Depression in her mother; Drug abuse in her brother; Fibromyalgia in her mother; Hypertension in her mother; Kidney Stones in her father; Miscarriages / [place] in her mother.  Social History Patient  reports that she has never smoked. She has never used smokeless tobacco. She reports that she does not drink alcohol or use drugs.    Objective: Vitals:   05/19/20 1304  BP: 100/68  Pulse: 84  Temp: 98.3 F (36.8 C)  TempSrc: Temporal  SpO2: 95%  Weight: 171 lb 9.6 oz (77.8 kg)  Height: 5\' 8"  (1.727 m)    Body mass index is 26.09 kg/m.  Physical Exam Vitals reviewed.  Constitutional:      Appearance: Normal appearance.  HENT:     Head: Normocephalic and atraumatic.  Cardiovascular:     Rate and Rhythm: Normal rate and regular rhythm.     Heart sounds: Normal heart sounds.  Pulmonary:     Effort: Pulmonary effort is normal.     Breath sounds: Normal breath sounds.  Abdominal:     General: Bowel sounds are normal.     Palpations: Abdomen is soft.     Comments: Negative murphys sign   Musculoskeletal:     Cervical back: Normal range of motion and neck supple.     Comments: No TTP over area of concern on back.   Neurological:     General: No focal deficit present.     Mental Status: She is alert and oriented to person, place, and time.  Sensory: No sensory deficit.     Motor: No weakness.     Gait: Gait normal.     Deep Tendon Reflexes: Reflexes normal.  Psychiatric:        Mood and Affect: Mood normal.        Behavior: Behavior normal.        Assessment/plan: 1. Acute right-sided thoracic back pain Appears more muscle related at this time and she can't actually feel any pain today. Will do trial of stretches, muscle relaxer, NSAIDs and voltaren gel. Also discussed to watch if associated with food as I have seen gallbladder pain present with pain in the right sided  back. If not better in 2 weeks with conservative therapy we will xray and discussed PT. She also let me know if any association with food or n/v, but no concerning findings on exam for gallbladder.     This visit occurred during the SARS-CoV-2 public health emergency.  Safety protocols were in place, including screening questions prior to the visit, additional usage of staff PPE, and extensive cleaning of exam room while observing appropriate contact time as indicated for disinfecting solutions.     Return if symptoms worsen or fail to improve.   Orland Mustard, MD [facility]   05/19/2020

## 2020-06-01 ENCOUNTER — Telehealth: Payer: Self-pay | Admitting: Family Medicine

## 2020-06-01 NOTE — Telephone Encounter (Signed)
Patient is calling in stating she would like an x-ray of her back because the pain is not getting any better.

## 2020-06-01 NOTE — Telephone Encounter (Signed)
Please advise 

## 2020-06-02 ENCOUNTER — Other Ambulatory Visit: Payer: Self-pay | Admitting: Family Medicine

## 2020-06-02 DIAGNOSIS — M546 Pain in thoracic spine: Secondary | ICD-10-CM

## 2020-06-02 NOTE — Telephone Encounter (Signed)
My Chart message sent

## 2020-06-02 NOTE — Telephone Encounter (Signed)
Let her know I have ordered the xray but she will have to go to [facility] to have this done.   I have ordered xrays for you. At this time we do not have xrays in our clinic. You will have to go to our [facility]. The address is [address]  xray is located in the basement.  Hours of operation are M-F 8:30am to 5:00pm.  Closed for lunch between 12:30 and 1:00pm.   I still think PT would be helpful, is she interested in doing this?  Dr. Artis Flock

## 2020-06-04 ENCOUNTER — Ambulatory Visit (INDEPENDENT_AMBULATORY_CARE_PROVIDER_SITE_OTHER)
Admission: RE | Admit: 2020-06-04 | Discharge: 2020-06-04 | Disposition: A | Discharge status: Home / Self Care | Payer: BC Managed Care – PPO | Source: Ambulatory Visit | Attending: Family Medicine | Admitting: Family Medicine

## 2020-06-04 ENCOUNTER — Other Ambulatory Visit: Payer: Self-pay

## 2020-06-04 DIAGNOSIS — M546 Pain in thoracic spine: Secondary | ICD-10-CM

## 2020-08-23 ENCOUNTER — Telehealth: Payer: Self-pay

## 2020-08-23 NOTE — Telephone Encounter (Signed)
Patient is calling in regards to having breast pain and a yeast infection.

## 2020-08-23 NOTE — Telephone Encounter (Signed)
Spoke with patient. Patient reports intermittent, sharp, left breast pain that started approximately 2 wks ago. Pain has improved, but still present. Reports Hx of fibrocystic breast, states this pain feels different. Denies redness, nipple d/c, lumps, fever/chills.   Reports vaginal itching and burning for the past 2 wks. Has tried adding probiotics and diet changes, no change in symptoms. Denies vag d/c, odor, urinary symptoms. Requesting OV.   Last AEX 02/02/20  OV scheduled for 08/24/20 at 10:30am. Patient is agreeable to date and time.   Encounter closed.

## 2020-08-24 ENCOUNTER — Other Ambulatory Visit: Payer: Self-pay | Admitting: Obstetrics and Gynecology

## 2020-08-24 ENCOUNTER — Telehealth: Payer: Self-pay

## 2020-08-24 ENCOUNTER — Encounter: Payer: Self-pay | Admitting: Obstetrics and Gynecology

## 2020-08-24 ENCOUNTER — Other Ambulatory Visit: Payer: Self-pay

## 2020-08-24 ENCOUNTER — Ambulatory Visit: Payer: BC Managed Care – PPO | Admitting: Obstetrics and Gynecology

## 2020-08-24 VITALS — BP 118/62 | HR 91 | Ht 67.0 in | Wt 179.4 lb

## 2020-08-24 DIAGNOSIS — N644 Mastodynia: Secondary | ICD-10-CM | POA: Diagnosis not present

## 2020-08-24 DIAGNOSIS — B373 Candidiasis of vulva and vagina: Secondary | ICD-10-CM

## 2020-08-24 DIAGNOSIS — N6320 Unspecified lump in the left breast, unspecified quadrant: Secondary | ICD-10-CM

## 2020-08-24 DIAGNOSIS — N6322 Unspecified lump in the left breast, upper inner quadrant: Secondary | ICD-10-CM

## 2020-08-24 DIAGNOSIS — B3731 Acute candidiasis of vulva and vagina: Secondary | ICD-10-CM

## 2020-08-24 MED ORDER — BETAMETHASONE VALERATE 0.1 % EX OINT: 1.0000 "application " | TOPICAL_OINTMENT | Freq: Two times a day (BID) | CUTANEOUS | 0 refills | Status: DC

## 2020-08-24 MED ORDER — FLUCONAZOLE 150 MG PO TABS: 150.0000 mg | ORAL_TABLET | Freq: Once | ORAL | 0 refills | Status: AC

## 2020-08-24 NOTE — Telephone Encounter (Signed)
-----   Message from Romualdo Bolk, MD sent at 08/24/2020 11:26 AM EDT ----- Please schedule her for diagnostic left breast imaging, pain and lump at 11 o'clock. Thanks, Noreene Larsson

## 2020-08-24 NOTE — Telephone Encounter (Signed)
Placed in MMG hold.  

## 2020-08-24 NOTE — Patient Instructions (Signed)
Vaginal Yeast Infection, Adult  Vaginal yeast infection is a condition that causes vaginal discharge as well as soreness, swelling, and redness (inflammation) of the vagina. This is a common condition. Some women get this infection frequently. What are the causes? This condition is caused by a change in the normal balance of the yeast (candida) and bacteria that live in the vagina. This change causes an overgrowth of yeast, which causes the inflammation. What increases the risk? The condition is more likely to develop in women who:  Take antibiotic medicines.  Have diabetes.  Take birth control pills.  Are pregnant.  Douche often.  Have a weak body defense system (immune system).  Have been taking steroid medicines for a long time.  Frequently wear tight clothing. What are the signs or symptoms? Symptoms of this condition include:  White, thick, creamy vaginal discharge.  Swelling, itching, redness, and irritation of the vagina. The lips of the vagina (vulva) may be affected as well.  Pain or a burning feeling while urinating.  Pain during sex. How is this diagnosed? This condition is diagnosed based on:  Your medical history.  A physical exam.  A pelvic exam. Your health care provider will examine a sample of your vaginal discharge under a microscope. Your health care provider may send this sample for testing to confirm the diagnosis. How is this treated? This condition is treated with medicine. Medicines may be over-the-counter or prescription. You may be told to use one or more of the following:  Medicine that is taken by mouth (orally).  Medicine that is applied as a cream (topically).  Medicine that is inserted directly into the vagina (suppository). Follow these instructions at home:  Lifestyle  Do not have sex until your health care provider approves. Tell your sex partner that you have a yeast infection. That person should go to his or her health care  provider and ask if they should also be treated.  Do not wear tight clothes, such as pantyhose or tight pants.  Wear breathable cotton underwear. General instructions  Take or apply over-the-counter and prescription medicines only as told by your health care provider.  Eat more yogurt. This may help to keep your yeast infection from returning.  Do not use tampons until your health care provider approves.  Try taking a sitz bath to help with discomfort. This is a warm water bath that is taken while you are sitting down. The water should only come up to your hips and should cover your buttocks. Do this 3-4 times per day or as told by your health care provider.  Do not douche.  If you have diabetes, keep your blood sugar levels under control.  Keep all follow-up visits as told by your health care provider. This is important. Contact a health care provider if:  You have a fever.  Your symptoms go away and then return.  Your symptoms do not get better with treatment.  Your symptoms get worse.  You have new symptoms.  You develop blisters in or around your vagina.  You have blood coming from your vagina and it is not your menstrual period.  You develop pain in your abdomen. Summary  Vaginal yeast infection is a condition that causes discharge as well as soreness, swelling, and redness (inflammation) of the vagina.  This condition is treated with medicine. Medicines may be over-the-counter or prescription.  Take or apply over-the-counter and prescription medicines only as told by your health care provider.  Do not douche.   Do not have sex or use tampons until your health care provider approves.  Contact a health care provider if your symptoms do not get better with treatment or your symptoms go away and then return. This information is not intended to replace advice given to you by your health care provider. Make sure you discuss any questions you have with your health care  provider. Document Revised: 06/27/2019 Document Reviewed: 04/15/2018 Elsevier Patient Education  2020 [employer]. Breast Tenderness Breast tenderness is a common problem for women of all ages, but may also occur in men. Breast tenderness may range from mild discomfort to severe pain. In women, the pain usually comes and goes with the menstrual cycle, but it can also be constant. Breast tenderness has many possible causes, including hormone changes, infections, and taking certain medicines. You may have tests, such as a mammogram or an ultrasound, to check for any unusual findings. Having breast tenderness usually does not mean that you have breast cancer. Follow these instructions at home: Managing pain and discomfort   If directed, put ice to the painful area. To do this: ? Put ice in a plastic bag. ? Place a towel between your skin and the bag. ? Leave the ice on for 20 minutes, 2-3 times a day.  Wear a supportive bra, especially during exercise. You may also want to wear a supportive bra while sleeping if your breasts are very tender. Medicines  Take over-the-counter and prescription medicines only as told by your health care provider. If the cause of your pain is infection, you may be prescribed an antibiotic medicine.  If you were prescribed an antibiotic, take it as told by your health care provider. Do not stop taking the antibiotic even if you start to feel better. Eating and drinking  Your health care provider may recommend that you lessen the amount of fat in your diet. You can do this by: ? Limiting fried foods. ? Cooking foods using methods such as baking, boiling, grilling, and broiling.  Decrease the amount of caffeine in your diet. Instead, drink more water and choose caffeine-free drinks. General instructions   Keep a log of the days and times when your breasts are most tender.  Ask your health care provider how to do breast exams at home. This will help you notice  if you have an unusual growth or lump.  Keep all follow-up visits as told by your health care provider. This is important. Contact a health care provider if:  Any part of your breast is hard, red, and hot to the touch. This may be a sign of infection.  You are a woman and: ? Not breastfeeding and you have fluid, especially blood or pus, coming out of your nipples. ? Have a new or painful lump in your breast that remains after your menstrual period ends.  You have a fever.  Your pain does not improve or it gets worse.  Your pain is interfering with your daily activities. Summary  Breast tenderness may range from mild discomfort to severe pain.  Breast tenderness has many possible causes, including hormone changes, infections, and taking certain medicines.  It can be treated with ice, wearing a supportive bra, and medicines.  Make changes to your diet if told to by your health care provider. This information is not intended to replace advice given to you by your health care provider. Make sure you discuss any questions you have with your health care provider. Document Revised: 04/21/2019 Document Reviewed:  04/21/2019 Elsevier Patient Education  2020 [employer].

## 2020-08-24 NOTE — Telephone Encounter (Signed)
Call placed to [facility] to schedule dx Left MMG and [place]. Spoke with [employer]. Orders placed by TBC for co-sign.   Pt scheduled for 9/28@ 840 am at [facility].   Call placed to pt. Spoke with pt.  Pt agreeable and verbalized understanding to date and time of appt. Routing to Dr Oscar La for update Encounter closed.  Cc: Noreene Larsson, RN for MMG hold

## 2020-08-24 NOTE — Progress Notes (Signed)
GYNECOLOGY  VISIT   HPI: 44 y.o.   Married White or Caucasian Not Hispanic or Latino  female   [phone] with Patient's last menstrual period was 08/05/2020.   here for left breast pain. Patient states that the pain started about 2 weeks ago. She states its a 5 out of 10 on a pain scale.  She is also having vaginal itching and burning.   She typically gets bilateral breast soreness prior to her cycles. This is different. She c/o a 2 week h/o an intermittent sharp pain. It was tender to palpation, was getting a little better, then this weekend it got worse again. Worse when she bends or moves. No lump. Normal mammogram in 3/21. No increase in caffeine.  She c/o a 1 month h/o intermittent vaginal itching and burning, worse in the last week. The external itching and burning has been severe in the last week. No vaginal d/c. She has been applying coconut oil.   GYNECOLOGIC HISTORY: Patient's last menstrual period was 08/05/2020. Contraception: Condoms  Menopausal hormone therapy: none         OB History    Gravida  3   Para  2   Term  2   Preterm      AB  1   Living  2     SAB  1   TAB      Ectopic      Multiple      Live Births  2              Patient Active Problem List   Diagnosis Date Noted  . Family history of breast cancer 02/02/2020  . Vitamin B12 deficiency 04/30/2018  . Paresthesias 04/29/2018  . GAD (generalized anxiety disorder) 02/15/2016  . Obsessional thoughts 02/15/2016  . Ureteral calculus, left 04/28/2015    Past Medical History:  Diagnosis Date  . Allergy    seasonal  . Anxiety   . Chronic kidney disease   . Depression   . History of vertigo   . Migraines   . Panic attack   . Renal calculi   . Urine incontinence   . Vitamin B12 deficiency 04/30/2018    Past Surgical History:  Procedure Laterality Date  . CESAREAN SECTION    . CYSTOSCOPY W/ URETERAL STENT PLACEMENT Left 04/29/2015   Procedure: CYSTOSCOPY WITH RETROGRADE PYELOGRAM/LEFT  URETEROSCOPY/STONE BASKETRY/ LEFT URETERAL STENT PLACEMENT;  Surgeon: Marcine Matar, MD;  Location: [facility];  Service: Urology;  Laterality: Left;    Current Outpatient Medications  Medication Sig Dispense Refill  . co-enzyme Q-10 30 MG capsule Take 100 mg by mouth 3 (three) times daily.    . cyanocobalamin ([employer] VITAMIN B12) 1000 MCG tablet Take 1 tablet (1,000 mcg total) by mouth daily. 90 tablet 3  . Fish Oil-Cholecalciferol (FISH OIL-VITAMIN D) 1200-1000 MG-UNIT CAPS Take by mouth.    . IBUPROFEN PO Take by mouth.    . magnesium oxide (MAG-OX) 400 MG tablet Take 400 mg by mouth daily.     No current facility-administered medications for this visit.     ALLERGIES: Patient has no known allergies.  Family History  Problem Relation Age of Onset  . Fibromyalgia Mother   . Breast cancer Mother 82  . Depression Mother   . Hypertension Mother   . Miscarriages / [place] Mother   . Kidney Stones Father   . Anuerysm Father        renal   . Alcohol abuse Father   .  Drug abuse Brother     Social History   Socioeconomic History  . Marital status: Married    Spouse name: Not on file  . Number of children: Not on file  . Years of education: Not on file  . Highest education level: Not on file  Occupational History  . Not on file  Tobacco Use  . Smoking status: Never Smoker  . Smokeless tobacco: Never Used  Vaping Use  . Vaping Use: Never used  Substance and Sexual Activity  . Alcohol use: No  . Drug use: No  . Sexual activity: Yes    Partners: Male    Birth control/protection: Condom  Other Topics Concern  . Not on file  Social History Narrative  . Not on file   Social Determinants of Health   Financial Resource Strain:   . Difficulty of Paying Living Expenses: Not on file  Food Insecurity:   . Worried About Programme researcher, broadcasting/film/video in the Last Year: Not on file  . Ran Out of Food in the Last Year: Not on file  Transportation Needs:   . Lack of Transportation  (Medical): Not on file  . Lack of Transportation (Non-Medical): Not on file  Physical Activity:   . Days of Exercise per Week: Not on file  . Minutes of Exercise per Session: Not on file  Stress:   . Feeling of Stress : Not on file  Social Connections:   . Frequency of Communication with Friends and Family: Not on file  . Frequency of Social Gatherings with Friends and Family: Not on file  . Attends Religious Services: Not on file  . Active Member of Clubs or Organizations: Not on file  . Attends Banker Meetings: Not on file  . Marital Status: Not on file  Intimate Partner Violence:   . Fear of Current or Ex-Partner: Not on file  . Emotionally Abused: Not on file  . Physically Abused: Not on file  . Sexually Abused: Not on file    Review of Systems  Genitourinary:       Left breast pain.  Vaginal itching  Vaginal burning  All other systems reviewed and are negative.   PHYSICAL EXAMINATION:    BP 118/62   Pulse 91   Ht 5\' 7"  (1.702 m)   Wt 179 lb 6.4 oz (81.4 kg)   LMP 08/05/2020   SpO2 100%   BMI 28.10 kg/m     General appearance: alert, cooperative and appears stated age Breasts: in the left breast in the periphery at 11 o'clock is a 2.5 x 1 cm, tender, smooth, mobile lump vs ridge of tissue. No other lumps.  No axillary or supraclavicular adenopathy  Pelvic: External genitalia:  no lesions              Urethra:  normal appearing urethra with no masses, tenderness or lesions              Bartholins and Skenes: normal                 Vagina: normal appearing vagina with normal color and discharge, no lesions              Cervix: no lesions               Chaperone was present for exam.  ASSESSMENT Left breast pain, lump at 11 o'clock near the periphery Yeast vaginitis    PLAN Send for diagnostic left breast imaging, mammogram and  ultrasound Diflucan Steroid ointment

## 2020-08-25 ENCOUNTER — Telehealth: Payer: Self-pay | Admitting: Obstetrics and Gynecology

## 2020-08-25 NOTE — Telephone Encounter (Signed)
Spoke to pt. Pt states wanting a earlier appt than 2 weeks out for Dx MMG. Advised pt to call and reschedule through [facility]. Pt agreeable and verbalized understanding.  Encounter closed

## 2020-08-25 NOTE — Telephone Encounter (Signed)
Patient says she can't get an appointment for 2 weeks with the breast center. Would like to speak with nurse to try and get in sooner.p

## 2020-08-26 ENCOUNTER — Ambulatory Visit
Admission: RE | Admit: 2020-08-26 | Discharge: 2020-08-26 | Disposition: A | Discharge status: Home / Self Care | Payer: BC Managed Care – PPO | Source: Ambulatory Visit | Attending: Obstetrics and Gynecology | Admitting: Obstetrics and Gynecology

## 2020-08-26 ENCOUNTER — Other Ambulatory Visit: Payer: Self-pay

## 2020-08-26 DIAGNOSIS — N644 Mastodynia: Secondary | ICD-10-CM

## 2020-08-26 DIAGNOSIS — N6320 Unspecified lump in the left breast, unspecified quadrant: Secondary | ICD-10-CM

## 2020-08-27 ENCOUNTER — Telehealth: Payer: Self-pay | Admitting: *Deleted

## 2020-08-27 NOTE — Telephone Encounter (Signed)
-----   Message from Romualdo Bolk, MD sent at 08/26/2020  7:14 PM EDT ----- Please schedule the patient for a f/u breast check in 3 months and take her out of mammogram hold

## 2020-08-27 NOTE — Telephone Encounter (Signed)
Patient returned a call to Jill.   

## 2020-08-27 NOTE — Telephone Encounter (Signed)
Spoke with patient, advised as seen below per Dr. Oscar La.  OV scheduled for 11/24/20 at 11:30am w/ Dr. Oscar La.  Patient verbalizes understanding and is agreeable.  Encounter closed.

## 2020-08-27 NOTE — Telephone Encounter (Signed)
Leda Min, RN  08/27/2020 8:15 AM EDT Back to Top    Left message to call Noreene Larsson, RN at [facility] [phone].   Removed from MMG hold.

## 2020-09-07 ENCOUNTER — Other Ambulatory Visit: Payer: BC Managed Care – PPO

## 2020-10-13 ENCOUNTER — Encounter: Payer: Self-pay | Admitting: Family Medicine

## 2020-10-13 ENCOUNTER — Ambulatory Visit (INDEPENDENT_AMBULATORY_CARE_PROVIDER_SITE_OTHER): Payer: BC Managed Care – PPO | Admitting: Family Medicine

## 2020-10-13 ENCOUNTER — Other Ambulatory Visit: Payer: Self-pay

## 2020-10-13 VITALS — BP 114/76 | HR 81 | Temp 98.4°F | Ht 67.0 in | Wt 178.8 lb

## 2020-10-13 DIAGNOSIS — Z1159 Encounter for screening for other viral diseases: Secondary | ICD-10-CM

## 2020-10-13 DIAGNOSIS — Z0000 Encounter for general adult medical examination without abnormal findings: Secondary | ICD-10-CM

## 2020-10-13 DIAGNOSIS — E538 Deficiency of other specified B group vitamins: Secondary | ICD-10-CM | POA: Diagnosis not present

## 2020-10-13 DIAGNOSIS — Z23 Encounter for immunization: Secondary | ICD-10-CM

## 2020-10-13 NOTE — Progress Notes (Signed)
Patient: Diamond Hodges MRN: [id number] DOB: 09-29-1976 PCP: Orland Mustard, MD     Subjective:  Chief Complaint  Patient presents with   Annual Exam    HPI: The patient is a 44 y.o. female who presents today for annual exam. She denies any changes to past medical history. There have been no recent hospitalizations. They are following a well balanced diet and exercise plan. She walks 4 days a week, lifts weight and does yoga. Weight has been stable. No complaints today.   Family history of breast cancer in her mother at age 37 years. No hx of colon cancer.   Needs flu shot.   Immunization History  Administered Date(s) Administered   Influenza,inj,Quad PF,6+ Mos 10/03/2019, 10/13/2020   PFIZER SARS-COV-2 Vaccination 03/26/2020, 04/16/2020   Colonoscopy: routine screening  Mammogram: 02/24/2020 Pap smear: 01/01/2017. Negative hpv. F/u in 5 years.  Tdap: she thinks in 2012 or 2013.    Review of Systems  Constitutional: Negative for chills, fatigue and fever.  HENT: Negative for dental problem, ear pain, hearing loss and trouble swallowing.   Eyes: Negative for visual disturbance.  Respiratory: Negative for cough, chest tightness and shortness of breath.   Cardiovascular: Negative for chest pain, palpitations and leg swelling.  Gastrointestinal: Negative for abdominal pain, blood in stool, diarrhea and nausea.  Endocrine: Negative for cold intolerance, polydipsia, polyphagia and polyuria.  Genitourinary: Negative for dysuria, frequency, hematuria and pelvic pain.  Musculoskeletal: Negative for arthralgias.  Skin: Negative for rash.  Neurological: Negative for dizziness and headaches.  Psychiatric/Behavioral: Negative for dysphoric mood and sleep disturbance. The patient is not nervous/anxious.     Allergies Patient has No Known Allergies.  Past Medical History Patient  has a past medical history of Allergy, Anxiety, Chronic kidney disease, Depression, History of  vertigo, Migraines, Panic attack, Renal calculi, Urine incontinence, and Vitamin B12 deficiency (04/30/2018).  Surgical History Patient  has a past surgical history that includes Cesarean section and Cystoscopy w/ ureteral stent placement (Left, 04/29/2015).  Family History Pateint's family history includes Alcohol abuse in her father; Anuerysm in her father; Breast cancer (age of onset: 19) in her mother; Depression in her mother; Drug abuse in her brother; Fibromyalgia in her mother; Hypertension in her mother; Kidney Stones in her father; Miscarriages / [place] in her mother.  Social History Patient  reports that she has never smoked. She has never used smokeless tobacco. She reports that she does not drink alcohol and does not use drugs.    Objective: Vitals:   10/13/20 1315  BP: 114/76  Pulse: 81  Temp: 98.4 F (36.9 C)  TempSrc: Temporal  SpO2: 99%  Weight: 178 lb 12.8 oz (81.1 kg)  Height: 5\' 7"  (1.702 m)    Body mass index is 28 kg/m.  Physical Exam Vitals reviewed.  Constitutional:      Appearance: Normal appearance. She is well-developed. She is obese.  HENT:     Head: Normocephalic and atraumatic.     Right Ear: Tympanic membrane, ear canal and external ear normal.     Left Ear: Tympanic membrane, ear canal and external ear normal.     Nose: Nose normal.     Mouth/Throat:     Mouth: Mucous membranes are moist.  Eyes:     Extraocular Movements: Extraocular movements intact.     Conjunctiva/sclera: Conjunctivae normal.     Pupils: Pupils are equal, round, and reactive to light.  Neck:     Thyroid: No thyromegaly.  Cardiovascular:  Rate and Rhythm: Normal rate and regular rhythm.     Pulses: Normal pulses.     Heart sounds: Normal heart sounds. No murmur heard.   Pulmonary:     Effort: Pulmonary effort is normal.     Breath sounds: Normal breath sounds.  Abdominal:     General: Bowel sounds are normal. There is no distension.     Palpations: Abdomen  is soft.     Tenderness: There is no abdominal tenderness.  Musculoskeletal:     Cervical back: Normal range of motion and neck supple.  Lymphadenopathy:     Cervical: No cervical adenopathy.  Skin:    General: Skin is warm and dry.     Capillary Refill: Capillary refill takes less than 2 seconds.     Findings: No rash.  Neurological:     General: No focal deficit present.     Mental Status: She is alert and oriented to person, place, and time.     Cranial Nerves: No cranial nerve deficit.     Coordination: Coordination normal.     Deep Tendon Reflexes: Reflexes normal.  Psychiatric:        Mood and Affect: Mood normal.        Behavior: Behavior normal.     GAD 7 : Generalized Anxiety Score 10/13/2020 10/03/2019  Nervous, Anxious, on Edge 0 0  Control/stop worrying 0 0  Worry too much - different things 0 0  Trouble relaxing 0 0  Restless 0 0  Easily annoyed or irritable 0 1  Afraid - awful might happen 0 0  Total GAD 7 Score 0 1  Anxiety Difficulty Not difficult at all Not difficult at all        Office Visit from 05/19/2020 in [facility]  PHQ-2 Total Score 0         Assessment/plan: 1. Annual physical exam Doing great. Not fasting today. HM up to date. Encouraged exercise/diet. Overall keep up the good work. F/u in one year.  Patient counseling [x]    Nutrition: Stressed importance of moderation in sodium/caffeine intake, saturated fat and cholesterol, caloric balance, sufficient intake of fresh fruits, vegetables, fiber, calcium, iron, and 1 mg of folate supplement per day (for females capable of pregnancy).  [x]    Stressed the importance of regular exercise.   []    Substance Abuse: Discussed cessation/primary prevention of tobacco, alcohol, or other drug use; driving or other dangerous activities under the influence; availability of treatment for abuse.   [x]    Injury prevention: Discussed safety belts, safety helmets, smoke detector, smoking  near bedding or upholstery.   [x]    Sexuality: Discussed sexually transmitted diseases, partner selection, use of condoms, avoidance of unintended pregnancy  and contraceptive alternatives.  [x]    Dental health: Discussed importance of regular tooth brushing, flossing, and dental visits.  [x]    Health maintenance and immunizations reviewed. Please refer to Health maintenance section.    - CBC with Differential/Platelet; Future - Lipid panel; Future - TSH; Future - COMPLETE METABOLIC PANEL WITH GFR; Future - COMPLETE METABOLIC PANEL WITH GFR - TSH - Lipid panel - CBC with Differential/Platelet  2. Need for immunization against influenza  - Flu Vaccine QUAD 36+ mos IM  3. Encounter for hepatitis C screening test for low risk patient   - Hepatitis C antibody; Future - Hepatitis C antibody  4. Vitamin B12 deficiency Not currently on any replacement. Hx of deficiency.  - Vitamin B12; Future - Vitamin B12  This visit occurred during the SARS-CoV-2 public health emergency.  Safety protocols were in place, including screening questions prior to the visit, additional usage of staff PPE, and extensive cleaning of exam room while observing appropriate contact time as indicated for disinfecting solutions.     Return in about 1 year (around 10/13/2021) for annual with fasting labs. Orland Mustard, MD [facility]  10/13/2020

## 2020-10-13 NOTE — Patient Instructions (Signed)
Flu shot today and all of your labs.  Look great! Good seeing you! Dr. Rogers Blocker   Preventive Care 43-43 Years Old, Female Preventive care refers to visits with your health care provider and lifestyle choices that can promote health and wellness. This includes:  A yearly physical exam. This may also be called an annual well check.  Regular dental visits and eye exams.  Immunizations.  Screening for certain conditions.  Healthy lifestyle choices, such as eating a healthy diet, getting regular exercise, not using drugs or products that contain nicotine and tobacco, and limiting alcohol use. What can I expect for my preventive care visit? Physical exam Your health care provider will check your:  Height and weight. This may be used to calculate body mass index (BMI), which tells if you are at a healthy weight.  Heart rate and blood pressure.  Skin for abnormal spots. Counseling Your health care provider may ask you questions about your:  Alcohol, tobacco, and drug use.  Emotional well-being.  Home and relationship well-being.  Sexual activity.  Eating habits.  Work and work Statistician.  Method of birth control.  Menstrual cycle.  Pregnancy history. What immunizations do I need?  Influenza (flu) vaccine  This is recommended every year. Tetanus, diphtheria, and pertussis (Tdap) vaccine  You may need a Td booster every 10 years. Varicella (chickenpox) vaccine  You may need this if you have not been vaccinated. Zoster (shingles) vaccine  You may need this after age 41. Measles, mumps, and rubella (MMR) vaccine  You may need at least one dose of MMR if you were born in 1957 or later. You may also need a second dose. Pneumococcal conjugate (PCV13) vaccine  You may need this if you have certain conditions and were not previously vaccinated. Pneumococcal polysaccharide (PPSV23) vaccine  You may need one or two doses if you smoke cigarettes or if you have certain  conditions. Meningococcal conjugate (MenACWY) vaccine  You may need this if you have certain conditions. Hepatitis A vaccine  You may need this if you have certain conditions or if you travel or work in places where you may be exposed to hepatitis A. Hepatitis B vaccine  You may need this if you have certain conditions or if you travel or work in places where you may be exposed to hepatitis B. Haemophilus influenzae type b (Hib) vaccine  You may need this if you have certain conditions. Human papillomavirus (HPV) vaccine  If recommended by your health care provider, you may need three doses over 6 months. You may receive vaccines as individual doses or as more than one vaccine together in one shot (combination vaccines). Talk with your health care provider about the risks and benefits of combination vaccines. What tests do I need? Blood tests  Lipid and cholesterol levels. These may be checked every 5 years, or more frequently if you are over 87 years old.  Hepatitis C test.  Hepatitis B test. Screening  Lung cancer screening. You may have this screening every year starting at age 65 if you have a 30-pack-year history of smoking and currently smoke or have quit within the past 15 years.  Colorectal cancer screening. All adults should have this screening starting at age 54 and continuing until age 100. Your health care provider may recommend screening at age 56 if you are at increased risk. You will have tests every 1-10 years, depending on your results and the type of screening test.  Diabetes screening. This is done by  checking your blood sugar (glucose) after you have not eaten for a while (fasting). You may have this done every 1-3 years.  Mammogram. This may be done every 1-2 years. Talk with your health care provider about when you should start having regular mammograms. This may depend on whether you have a family history of breast cancer.  BRCA-related cancer screening. This  may be done if you have a family history of breast, ovarian, tubal, or peritoneal cancers.  Pelvic exam and Pap test. This may be done every 3 years starting at age 3. Starting at age 76, this may be done every 5 years if you have a Pap test in combination with an HPV test. Other tests  Sexually transmitted disease (STD) testing.  Bone density scan. This is done to screen for osteoporosis. You may have this scan if you are at high risk for osteoporosis. Follow these instructions at home: Eating and drinking  Eat a diet that includes fresh fruits and vegetables, whole grains, lean protein, and low-fat dairy.  Take vitamin and mineral supplements as recommended by your health care provider.  Do not drink alcohol if: ? Your health care provider tells you not to drink. ? You are pregnant, may be pregnant, or are planning to become pregnant.  If you drink alcohol: ? Limit how much you have to 0-1 drink a day. ? Be aware of how much alcohol is in your drink. In the [place] one drink equals one 12 oz bottle of beer (355 mL), one 5 oz glass of wine (148 mL), or one 1 oz glass of hard liquor (44 mL). Lifestyle  Take daily care of your teeth and gums.  Stay active. Exercise for at least 30 minutes on 5 or more days each week.  Do not use any products that contain nicotine or tobacco, such as cigarettes, e-cigarettes, and chewing tobacco. If you need help quitting, ask your health care provider.  If you are sexually active, practice safe sex. Use a condom or other form of birth control (contraception) in order to prevent pregnancy and STIs (sexually transmitted infections).  If told by your health care provider, take low-dose aspirin daily starting at age 90. What's next?  Visit your health care provider once a year for a well check visit.  Ask your health care provider how often you should have your eyes and teeth checked.  Stay up to date on all vaccines. This information is not  intended to replace advice given to you by your health care provider. Make sure you discuss any questions you have with your health care provider. Document Revised: 08/08/2018 Document Reviewed: 08/08/2018 Elsevier Patient Education  2020 [employer].

## 2020-10-14 LAB — COMPLETE METABOLIC PANEL WITH GFR
AG Ratio: 2 (calc) (ref 1.0–2.5)
ALT: 13 U/L (ref 6–29)
AST: 15 U/L (ref 10–30)
Albumin: 4.6 g/dL (ref 3.6–5.1)
Alkaline phosphatase (APISO): 61 U/L (ref 31–125)
BUN: 11 mg/dL (ref 7–25)
CO2: 27 mmol/L (ref 20–32)
Calcium: 9.5 mg/dL (ref 8.6–10.2)
Chloride: 105 mmol/L (ref 98–110)
Creat: 0.77 mg/dL (ref 0.50–1.10)
GFR, Est African American: 110 mL/min/{1.73_m2} (ref >=60)
GFR, Est Non African American: 95 mL/min/{1.73_m2} (ref >=60)
Globulin: 2.3 g/dL (calc) (ref 1.9–3.7)
Glucose, Bld: 84 mg/dL (ref 65–99)
Potassium: 4.7 mmol/L (ref 3.5–5.3)
Sodium: 140 mmol/L (ref 135–146)
Total Bilirubin: 0.5 mg/dL (ref 0.2–1.2)
Total Protein: 6.9 g/dL (ref 6.1–8.1)

## 2020-10-14 LAB — TSH: TSH: 1.66 mIU/L

## 2020-10-14 LAB — CBC WITH DIFFERENTIAL/PLATELET
Absolute Monocytes: 628 cells/uL (ref 200–950)
Basophils Absolute: 72 cells/uL (ref 0–200)
Basophils Relative: 0.7 %
Eosinophils Absolute: 155 cells/uL (ref 15–500)
Eosinophils Relative: 1.5 %
HCT: 40.1 % (ref 35.0–45.0)
Hemoglobin: 13.2 g/dL (ref 11.7–15.5)
Lymphs Abs: 2070 cells/uL (ref 850–3900)
MCH: 28.6 pg (ref 27.0–33.0)
MCHC: 32.9 g/dL (ref 32.0–36.0)
MCV: 87 fL (ref 80.0–100.0)
MPV: 11.2 fL (ref 7.5–12.5)
Monocytes Relative: 6.1 %
Neutro Abs: 7375 cells/uL (ref 1500–7800)
Neutrophils Relative %: 71.6 %
Platelets: 269 10*3/uL (ref 140–400)
RBC: 4.61 10*6/uL (ref 3.80–5.10)
RDW: 12.9 % (ref 11.0–15.0)
Total Lymphocyte: 20.1 %
WBC: 10.3 10*3/uL (ref 3.8–10.8)

## 2020-10-14 LAB — LIPID PANEL
Cholesterol: 203 mg/dL — ABNORMAL HIGH (ref <200)
HDL: 50 mg/dL (ref >=50)
LDL Cholesterol (Calc): 129 mg/dL (calc) — ABNORMAL HIGH
Non-HDL Cholesterol (Calc): 153 mg/dL (calc) — ABNORMAL HIGH (ref <130)
Total CHOL/HDL Ratio: 4.1 (calc) (ref <5.0)
Triglycerides: 125 mg/dL (ref <150)

## 2020-10-14 LAB — HEPATITIS C ANTIBODY
Hepatitis C Ab: NONREACTIVE
SIGNAL TO CUT-OFF: 0.01 (ref <1.00)

## 2020-10-14 LAB — VITAMIN B12: Vitamin B-12: 258 pg/mL (ref 200–1100)

## 2020-10-19 ENCOUNTER — Other Ambulatory Visit: Payer: Self-pay | Admitting: Family Medicine

## 2020-10-19 DIAGNOSIS — E538 Deficiency of other specified B group vitamins: Secondary | ICD-10-CM

## 2020-11-15 ENCOUNTER — Encounter: Payer: Self-pay | Admitting: Obstetrics and Gynecology

## 2020-11-24 ENCOUNTER — Telehealth: Payer: Self-pay

## 2020-11-24 ENCOUNTER — Encounter: Payer: Self-pay | Admitting: Obstetrics and Gynecology

## 2020-11-24 ENCOUNTER — Other Ambulatory Visit: Payer: Self-pay

## 2020-11-24 ENCOUNTER — Ambulatory Visit: Payer: BC Managed Care – PPO | Admitting: Obstetrics and Gynecology

## 2020-11-24 VITALS — BP 102/68 | HR 72 | Ht 67.0 in | Wt 176.0 lb

## 2020-11-24 DIAGNOSIS — N6321 Unspecified lump in the left breast, upper outer quadrant: Secondary | ICD-10-CM

## 2020-11-24 NOTE — Telephone Encounter (Signed)
Spoke with pt. Pt needing 1 week f/u per Dr Oscar La. Pt scheduled with Dr Oscar La on 12/21 at 1 pm. Pt agreeable to date and time of appt.  Encounter closed

## 2020-11-24 NOTE — Progress Notes (Signed)
GYNECOLOGY  VISIT   HPI: 44 y.o.   Married White or Caucasian Not Hispanic or Latino  female   [phone] with Patient's last menstrual period was 11/23/2020.   here for 3 month breast check. She was seen in 9/21 with left breast pain and was noted to have a 2.5 cm tender lump in the left breast at 11 o'clock.  Last MMG 08/26/20 density B Bi-rads one Neg, negative ultrasound as well.   GYNECOLOGIC HISTORY: Patient's last menstrual period was 11/23/2020. Contraception:Condoms  Menopausal hormone therapy: none        OB History    Gravida  3   Para  2   Term  2   Preterm      AB  1   Living  2     SAB  1   IAB      Ectopic      Multiple      Live Births  2              Patient Active Problem List   Diagnosis Date Noted  . Family history of breast cancer 02/02/2020  . Vitamin B12 deficiency 04/30/2018  . Paresthesias 04/29/2018  . GAD (generalized anxiety disorder) 02/15/2016  . Obsessional thoughts 02/15/2016  . Ureteral calculus, left 04/28/2015    Past Medical History:  Diagnosis Date  . Allergy    seasonal  . Anxiety   . Chronic kidney disease   . Depression   . History of vertigo   . Migraines   . Panic attack   . Renal calculi   . Urine incontinence   . Vitamin B12 deficiency 04/30/2018    Past Surgical History:  Procedure Laterality Date  . CESAREAN SECTION    . CYSTOSCOPY W/ URETERAL STENT PLACEMENT Left 04/29/2015   Procedure: CYSTOSCOPY WITH RETROGRADE PYELOGRAM/LEFT URETEROSCOPY/STONE BASKETRY/ LEFT URETERAL STENT PLACEMENT;  Surgeon: Marcine Matar, MD;  Location: [facility];  Service: Urology;  Laterality: Left;    Current Outpatient Medications  Medication Sig Dispense Refill  . [facility] VITAMIN B12 1000 MCG tablet TAKE 1 TABLET BY MOUTH EVERY DAY 90 tablet 3   No current facility-administered medications for this visit.     ALLERGIES: Patient has no known allergies.  Family History  Problem Relation Age of Onset  . Fibromyalgia  Mother   . Breast cancer Mother 36  . Depression Mother   . Hypertension Mother   . Miscarriages / [place] Mother   . Kidney Stones Father   . Anuerysm Father        renal   . Alcohol abuse Father   . Drug abuse Brother     Social History   Socioeconomic History  . Marital status: Married    Spouse name: Not on file  . Number of children: Not on file  . Years of education: Not on file  . Highest education level: Not on file  Occupational History  . Not on file  Tobacco Use  . Smoking status: Never Smoker  . Smokeless tobacco: Never Used  Vaping Use  . Vaping Use: Never used  Substance and Sexual Activity  . Alcohol use: No  . Drug use: No  . Sexual activity: Yes    Partners: Male    Birth control/protection: Condom  Other Topics Concern  . Not on file  Social History Narrative  . Not on file   Social Determinants of Health   Financial Resource Strain: Not on file  Food Insecurity: Not on  file  Transportation Needs: Not on file  Physical Activity: Not on file  Stress: Not on file  Social Connections: Not on file  Intimate Partner Violence: Not on file    Review of Systems  All other systems reviewed and are negative.   PHYSICAL EXAMINATION:    BP 102/68   Pulse 72   Ht 5\' 7"  (1.702 m)   Wt 176 lb (79.8 kg)   LMP 11/23/2020   SpO2 99%   BMI 27.57 kg/m     General appearance: alert, cooperative and appears stated age Breasts: bilateral fibrocystic changes. The prior lump at ll o'clock in the left breast isn't felt, but a lump is noted in the left breast at 1-2 o'clock at the periphery, ~1 cm smooth and mobile.  No axillary adenopathy  ASSESSMENT Prior breast lump has resolved, new lump felt in the left breast at 1-2 o'clock. Patient just started her cycle    PLAN Return next week after her cycle for a repeat breast exam

## 2020-11-24 NOTE — Telephone Encounter (Signed)
Patient need a 1 week appointment with Dr Oscar La. She is not available for time open on Wednesday of next week. Sending to triage to assist with scheduling.

## 2020-11-30 ENCOUNTER — Encounter: Payer: Self-pay | Admitting: Obstetrics and Gynecology

## 2020-11-30 ENCOUNTER — Ambulatory Visit: Payer: BC Managed Care – PPO | Admitting: Obstetrics and Gynecology

## 2020-11-30 ENCOUNTER — Other Ambulatory Visit: Payer: Self-pay

## 2020-11-30 ENCOUNTER — Ambulatory Visit: Payer: Self-pay | Admitting: Obstetrics and Gynecology

## 2020-11-30 VITALS — BP 122/64 | HR 70 | Ht 67.0 in | Wt 171.0 lb

## 2020-11-30 DIAGNOSIS — N6019 Diffuse cystic mastopathy of unspecified breast: Secondary | ICD-10-CM

## 2020-11-30 NOTE — Progress Notes (Signed)
GYNECOLOGY  VISIT   HPI: 44 y.o.   Married White or Caucasian Not Hispanic or Latino  female   [phone] with Patient's last menstrual period was 11/23/2020.   here for follow up beast check following cycle.  Last MMG 08/26/20 density B Bi-rads one Neg, negative ultrasound as well.   The patient is back for a breast check  She is considering a breast reduction. She does have chronic neck pain which she feels may be related to her large breasts.  She is working on weight loss. No shoulder or upper back pain.   GYNECOLOGIC HISTORY: Patient's last menstrual period was 11/23/2020. Contraception: Condoms  Menopausal hormone therapy: none         OB History    Gravida  3   Para  2   Term  2   Preterm      AB  1   Living  2     SAB  1   IAB      Ectopic      Multiple      Live Births  2              Patient Active Problem List   Diagnosis Date Noted   Family history of breast cancer 02/02/2020   Vitamin B12 deficiency 04/30/2018   Paresthesias 04/29/2018   GAD (generalized anxiety disorder) 02/15/2016   Obsessional thoughts 02/15/2016   Ureteral calculus, left 04/28/2015    Past Medical History:  Diagnosis Date   Allergy    seasonal   Anxiety    Chronic kidney disease    Depression    History of vertigo    Migraines    Panic attack    Renal calculi    Urine incontinence    Vitamin B12 deficiency 04/30/2018    Past Surgical History:  Procedure Laterality Date   CESAREAN SECTION     CYSTOSCOPY W/ URETERAL STENT PLACEMENT Left 04/29/2015   Procedure: CYSTOSCOPY WITH RETROGRADE PYELOGRAM/LEFT URETEROSCOPY/STONE BASKETRY/ LEFT URETERAL STENT PLACEMENT;  Surgeon: Marcine Matar, MD;  Location: [facility];  Service: Urology;  Laterality: Left;    Current Outpatient Medications  Medication Sig Dispense Refill   [facility] VITAMIN B12 1000 MCG tablet TAKE 1 TABLET BY MOUTH EVERY DAY 90 tablet 3   No current facility-administered medications for  this visit.     ALLERGIES: Patient has no known allergies.  Family History  Problem Relation Age of Onset   Fibromyalgia Mother    Breast cancer Mother 58   Depression Mother    Hypertension Mother    Miscarriages / [place] Mother    Kidney Stones Father    Anuerysm Father        renal    Alcohol abuse Father    Drug abuse Brother     Social History   Socioeconomic History   Marital status: Married    Spouse name: Not on file   Number of children: Not on file   Years of education: Not on file   Highest education level: Not on file  Occupational History   Not on file  Tobacco Use   Smoking status: Never Smoker   Smokeless tobacco: Never Used  Vaping Use   Vaping Use: Never used  Substance and Sexual Activity   Alcohol use: No   Drug use: No   Sexual activity: Yes    Partners: Male    Birth control/protection: Condom  Other Topics Concern   Not on file  Social History Narrative  Not on file   Social Determinants of Health   Financial Resource Strain: Not on file  Food Insecurity: Not on file  Transportation Needs: Not on file  Physical Activity: Not on file  Stress: Not on file  Social Connections: Not on file  Intimate Partner Violence: Not on file    Review of Systems  All other systems reviewed and are negative.   PHYSICAL EXAMINATION:    LMP 11/23/2020     General appearance: alert, cooperative and appears stated age Breasts: bilateral fibrocystic changes, in the left breast at 1-2 o'clock at the periphery is a persistent ~1 cm smooth, mobile lump. Less prominant similar area noted in the right breast in the same region.   No axillary adenopathy.   ASSESSMENT Fibrocystic breast changes, slightly more prominent area on the left vs the right. Negative breast imaging in 9/21    PLAN She will continue breast self exams F/U for annual in 3 months Due for screening mammogram in 3 months, will scheduled after her  mammogram in case she needs repeat diagnostic imaging.

## 2020-12-01 ENCOUNTER — Encounter: Payer: Self-pay | Admitting: Obstetrics and Gynecology

## 2021-01-24 ENCOUNTER — Other Ambulatory Visit: Payer: Self-pay | Admitting: Obstetrics and Gynecology

## 2021-01-24 DIAGNOSIS — Z1231 Encounter for screening mammogram for malignant neoplasm of breast: Secondary | ICD-10-CM

## 2021-02-16 NOTE — Progress Notes (Signed)
45 y.o. Diamond Hodges Married White or Caucasian Not Hispanic or Latino female here for annual exam. She feels the prior breast lump on the left has resolved.    Period Cycle (Days): 28 Period Duration (Days): 5 Period Pattern: Regular Menstrual Flow: Heavy Menstrual Control: Maxi pad,Tampon Menstrual Control Change Freq (Hours): 3 Dysmenorrhea: (!) Moderate Dysmenorrhea Symptoms: Nausea,Headache,Crampingpatient states that she would like to talk about perimenopause. In the last 4 months her menstrual cramps have gotten worse, severe at times. She was bleeding heavily and passing clots. 400 mg of ibuprofen didn't help, 800 mg of ibuprofen and a heating pad helped.  Her cycles have been more irregular, ranging from 21 to 35 days in the last few years. 2 cycles in the last year were 35 days.  Feels more emotional all month long. Mild depression and anxiety. She has been talking with a Veterinary surgeon.  She is always hot when sleeping, not a change. No vasomotor symptoms.  Both her mom and GM were done with cycles at 46.   Husband had a vasectomy, had post vasectomy pain syndrome. Had a reversal and no help with his pain.   Patient's last menstrual period was 01/28/2021.          Sexually active: Yes.    The current method of family planning is condoms always.    Exercising: Yes.    walking  Smoker:  no  Health Maintenance: Pap:  01/01/17 normal, negative HPV  History of abnormal Pap:  no MMG:  08/26/20  BMD:   None  Colonoscopy: none TDaP:  2013 Gardasil: none    reports that she has never smoked. She has never used smokeless tobacco. She reports that she does not drink alcohol and does not use drugs. 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 Daughters are 56 and 30.   Past Medical History:  Diagnosis Date  . Allergy    seasonal  . Anxiety   . Chronic kidney disease   . Depression   . History of vertigo   . Migraine with aura   . Panic attack   . Renal calculi   . Urine incontinence   .  Vitamin B12 deficiency 04/30/2018    Past Surgical History:  Procedure Laterality Date  . CESAREAN SECTION    . CYSTOSCOPY W/ URETERAL STENT PLACEMENT Left 04/29/2015   Procedure: CYSTOSCOPY WITH RETROGRADE PYELOGRAM/LEFT URETEROSCOPY/STONE BASKETRY/ LEFT URETERAL STENT PLACEMENT;  Surgeon: Marcine Matar, MD;  Location: [facility];  Service: Urology;  Laterality: Left;    Current Outpatient Medications  Medication Sig Dispense Refill  . [facility] VITAMIN B12 1000 MCG tablet TAKE 1 TABLET BY MOUTH EVERY DAY 90 tablet 3   No current facility-administered medications for this visit.    Family History  Problem Relation Age of Onset  . Fibromyalgia Mother   . Breast cancer Mother 13  . Depression Mother   . Hypertension Mother   . Miscarriages / [place] Mother   . Kidney Stones Father   . Anuerysm Father        renal   . Alcohol abuse Father   . Drug abuse Brother     Review of Systems  All other systems reviewed and are negative.   Exam:   BP 130/64   Pulse 85   Ht 5\' 7"  (1.702 m)   Wt 165 lb (74.8 kg)   LMP 01/28/2021   SpO2 98%   BMI 25.84 kg/m   Weight change: @WEIGHTCHANGE @ Height:   Height: 5\' 7"  (170.2 cm)  Ht Readings from Last 3 Encounters:  02/17/21 5\' 7"  (1.702 m)  11/30/20 5\' 7"  (1.702 m)  11/24/20 5\' 7"  (1.702 m)    General appearance: alert, cooperative and appears stated age Head: Normocephalic, without obvious abnormality, atraumatic Neck: no adenopathy, supple, symmetrical, trachea midline and thyroid normal to inspection and palpation Lungs: clear to auscultation bilaterally Cardiovascular: regular rate and rhythm Breasts: in the periphery of the left breast at 1-2 o'clock is a 2 cm, mobile, not tender prominant ridge of tissue. Not felt on the other side. No skin changes.  Abdomen: soft, non-tender; non distended,  no masses,  no organomegaly Extremities: extremities normal, atraumatic, no cyanosis or edema Skin: Skin color, texture, turgor normal.  No rashes or lesions Lymph nodes: Cervical, supraclavicular, and axillary nodes normal. No abnormal inguinal nodes palpated Neurologic: Grossly normal   Pelvic: External genitalia:  no lesions              Urethra:  normal appearing urethra with no masses, tenderness or lesions              Bartholins and Skenes: normal                 Vagina: normal appearing vagina with normal color and discharge, no lesions              Cervix: no lesions               Bimanual Exam:  Uterus:  normal size, contour, position, consistency, mobility, non-tender              Adnexa: no mass, fullness, tenderness               Rectovaginal: Confirms               Anus:  normal sphincter tone, no lesions  chaperoned for the exam.     1. Well woman exam Discussed breast self exam Discussed calcium and vit D intake   2. Irregular menstrual cycle Still within the realm of normal. She will continue to calendar and call with any concerns Not a candidate for OCP's  3. Mood changes She is working on dietary changes, exercise and counseling. Declines medication  4. Severe dysmenorrhea Not a candidate for OCP's - ibuprofen (ADVIL) 800 MG tablet; Take 1 tablet (800 mg total) by mouth every 8 (eight) hours as needed.  Dispense: 30 tablet; Refill: 1  5. Screening for cervical cancer - Cytology - PAP with hpv

## 2021-02-17 ENCOUNTER — Encounter: Payer: Self-pay | Admitting: Obstetrics and Gynecology

## 2021-02-17 ENCOUNTER — Ambulatory Visit: Payer: BC Managed Care – PPO | Admitting: Obstetrics and Gynecology

## 2021-02-17 ENCOUNTER — Other Ambulatory Visit (HOSPITAL_COMMUNITY)
Admission: RE | Admit: 2021-02-17 | Discharge: 2021-02-17 | Disposition: A | Discharge status: Home / Self Care | Payer: BC Managed Care – PPO | Source: Ambulatory Visit | Attending: Obstetrics and Gynecology | Admitting: Obstetrics and Gynecology

## 2021-02-17 ENCOUNTER — Other Ambulatory Visit: Payer: Self-pay

## 2021-02-17 VITALS — BP 130/64 | HR 85 | Ht 67.0 in | Wt 165.0 lb

## 2021-02-17 DIAGNOSIS — N946 Dysmenorrhea, unspecified: Secondary | ICD-10-CM

## 2021-02-17 DIAGNOSIS — N6321 Unspecified lump in the left breast, upper outer quadrant: Secondary | ICD-10-CM

## 2021-02-17 DIAGNOSIS — Z01419 Encounter for gynecological examination (general) (routine) without abnormal findings: Secondary | ICD-10-CM

## 2021-02-17 DIAGNOSIS — Z124 Encounter for screening for malignant neoplasm of cervix: Secondary | ICD-10-CM | POA: Diagnosis not present

## 2021-02-17 DIAGNOSIS — R4586 Emotional lability: Secondary | ICD-10-CM

## 2021-02-17 DIAGNOSIS — N926 Irregular menstruation, unspecified: Secondary | ICD-10-CM | POA: Diagnosis not present

## 2021-02-17 MED ORDER — IBUPROFEN 800 MG PO TABS: 800.0000 mg | ORAL_TABLET | Freq: Three times a day (TID) | ORAL | 1 refills | Status: DC | PRN

## 2021-02-17 NOTE — Patient Instructions (Addendum)
EXERCISE   We recommended that you start or continue a regular exercise program for good health. Physical activity is anything that gets your body moving, some is better than none. The CDC recommends 150 minutes per week of Moderate-Intensity Aerobic Activity and 2 or more days of Muscle Strengthening Activity.  Benefits of exercise are limitless: helps weight loss/weight maintenance, improves mood and energy, helps with depression and anxiety, improves sleep, tones and strengthens muscles, improves balance, improves bone density, protects from chronic conditions such as heart disease, high blood pressure and diabetes and so much more. To learn more visit: https://www.cdc.gov/physicalactivity/index.html  DIET: Good nutrition starts with a healthy diet of fruits, vegetables, whole grains, and lean protein sources. Drink plenty of water for hydration. Minimize empty calories, sodium, sweets. For more information about dietary recommendations visit: https://health.gov/our-work/nutrition-physical-activity/dietary-guidelines and https://www.myplate.gov/  ALCOHOL:  Women should limit their alcohol intake to no more than 7 drinks/beers/glasses of wine (combined, not each!) per week. Moderation of alcohol intake to this level decreases your risk of breast cancer and liver damage.  If you are concerned that you may have a problem, or your friends have told you they are concerned about your drinking, there are many resources to help. A well-known program that is free, effective, and available to all people all over the nation is Alcoholics Anonymous.  Check out this site to learn more: https://www.aa.org/   CALCIUM AND VITAMIN D:  Adequate intake of calcium and Vitamin D are recommended for bone health.  You should be getting between 1000-1200 mg of calcium and 800 units of Vitamin D daily between diet and supplements  PAP SMEARS:  Pap smears, to check for cervical cancer or precancers,  have traditionally been  done yearly, scientific advances have shown that most women can have pap smears less often.  However, every woman still should have a physical exam from her gynecologist every year. It will include a breast check, inspection of the vulva and vagina to check for abnormal growths or skin changes, a visual exam of the cervix, and then an exam to evaluate the size and shape of the uterus and ovaries. We will also provide age appropriate advice regarding health maintenance, like when you should have certain vaccines, screening for sexually transmitted diseases, bone density testing, colonoscopy, mammograms, etc.   MAMMOGRAMS:  All women over 40 years old should have a routine mammogram.   COLON CANCER SCREENING: Now recommend starting at age 45. At this time colonoscopy is not covered for routine screening until 50. There are take home tests that can be done between 45-49.   COLONOSCOPY:  Colonoscopy to screen for colon cancer is recommended for all women at age 50.  We know, you hate the idea of the prep.  We agree, BUT, having colon cancer and not knowing it is worse!!  Colon cancer so often starts as a polyp that can be seen and removed at colonscopy, which can quite literally save your life!  And if your first colonoscopy is normal and you have no family history of colon cancer, most women don't have to have it again for 10 years.  Once every ten years, you can do something that may end up saving your life, right?  We will be happy to help you get it scheduled when you are ready.  Be sure to check your insurance coverage so you understand how much it will cost.  It may be covered as a preventative service at no cost, but you should check   your particular policy.      Breast Self-Awareness Breast self-awareness means being familiar with how your breasts look and feel. It involves checking your breasts regularly and reporting any changes to your health care provider. Practicing breast self-awareness is  important. A change in your breasts can be a sign of a serious medical problem. Being familiar with how your breasts look and feel allows you to find any problems early, when treatment is more likely to be successful. All women should practice breast self-awareness, including women who have had breast implants. How to do a breast self-exam One way to learn what is normal for your breasts and whether your breasts are changing is to do a breast self-exam. To do a breast self-exam: Look for Changes  1. Remove all the clothing above your waist. 2. Stand in front of a mirror in a room with good lighting. 3. Put your hands on your hips. 4. Push your hands firmly downward. 5. Compare your breasts in the mirror. Look for differences between them (asymmetry), such as: ? Differences in shape. ? Differences in size. ? Puckers, dips, and bumps in one breast and not the other. 6. Look at each breast for changes in your skin, such as: ? Redness. ? Scaly areas. 7. Look for changes in your nipples, such as: ? Discharge. ? Bleeding. ? Dimpling. ? Redness. ? A change in position. Feel for Changes Carefully feel your breasts for lumps and changes. It is best to do this while lying on your back on the floor and again while sitting or standing in the shower or tub with soapy water on your skin. Feel each breast in the following way:  Place the arm on the side of the breast you are examining above your head.  Feel your breast with the other hand.  Start in the nipple area and make  inch (2 cm) overlapping circles to feel your breast. Use the pads of your three middle fingers to do this. Apply light pressure, then medium pressure, then firm pressure. The light pressure will allow you to feel the tissue closest to the skin. The medium pressure will allow you to feel the tissue that is a little deeper. The firm pressure will allow you to feel the tissue close to the ribs.  Continue the overlapping circles,  moving downward over the breast until you feel your ribs below your breast.  Move one finger-width toward the center of the body. Continue to use the  inch (2 cm) overlapping circles to feel your breast as you move slowly up toward your collarbone.  Continue the up and down exam using all three pressures until you reach your armpit.  Write Down What You Find  Write down what is normal for each breast and any changes that you find. Keep a written record with breast changes or normal findings for each breast. By writing this information down, you do not need to depend only on memory for size, tenderness, or location. Write down where you are in your menstrual cycle, if you are still menstruating. If you are having trouble noticing differences in your breasts, do not get discouraged. With time you will become more familiar with the variations in your breasts and more comfortable with the exam. How often should I examine my breasts? Examine your breasts every month. If you are breastfeeding, the best time to examine your breasts is after a feeding or after using a breast pump. If you menstruate, the best time to   examine your breasts is 5-7 days after your period is over. During your period, your breasts are lumpier, and it may be more difficult to notice changes. When should I see my health care provider? See your health care provider if you notice:  A change in shape or size of your breasts or nipples.  A change in the skin of your breast or nipples, such as a reddened or scaly area.  Unusual discharge from your nipples.  A lump or thick area that was not there before.  Pain in your breasts.  Anything that concerns you.  Williams Textbook of Endocrinology (14th ed., pp. 574-641). [place], [place]: Elsevier.">  Perimenopause Perimenopause is the normal time of a woman's life when the levels of estrogen, the female hormone produced by the ovaries, begin to decrease. This leads to changes in  menstrual periods before they stop completely (menopause). Perimenopause can begin 2-8 years before menopause. During perimenopause, the ovaries may or may not produce an egg and a woman can still become pregnant. What are the causes? This condition is caused by a natural change in hormone levels that happens as you get older. What increases the risk? This condition is more likely to start at an earlier age if you have certain medical conditions or have undergone treatments, including:  A tumor of the pituitary gland in the brain.  A disease that affects the ovaries and hormone production.  Certain cancer treatments, such as chemotherapy or hormone therapy, or radiation therapy on the pelvis.  Heavy smoking and excessive alcohol use.  Family history of early menopause. What are the signs or symptoms? Perimenopausal changes affect each woman differently. Symptoms of this condition may include:  Hot flashes.  Irregular menstrual periods.  Night sweats.  Changes in feelings about sex. This could be a decrease in sex drive or an increased discomfort around your sexuality.  Vaginal dryness.  Headaches.  Mood swings.  Depression.  Problems sleeping (insomnia).  Memory problems or trouble concentrating.  Irritability.  Tiredness.  Weight gain.  Anxiety.  Trouble getting pregnant. How is this diagnosed? This condition is diagnosed based on your medical history, a physical exam, your age, your menstrual history, and your symptoms. Hormone tests may also be done. How is this treated? In some cases, no treatment is needed. You and your health care provider should make a decision together about whether treatment is necessary. Treatment will be based on your individual condition and preferences. Various treatments are available, such as:  Menopausal hormone therapy (MHT).  Medicines to treat specific symptoms.  Acupuncture.  Vitamin or herbal supplements. Before starting  treatment, make sure to let your health care provider know if you have a personal or family history of:  Heart disease.  Breast cancer.  Blood clots.  Diabetes.  Osteoporosis. Follow these instructions at home: Medicines  Take over-the-counter and prescription medicines only as told by your health care provider.  Take vitamin supplements only as told by your health care provider.  Talk with your health care provider before starting any herbal supplements. Lifestyle  Do not use any products that contain nicotine or tobacco, such as cigarettes, e-cigarettes, and chewing tobacco. If you need help quitting, ask your health care provider.  Get at least 30 minutes of physical activity on 5 or more days each week.  Eat a balanced diet that includes fresh fruits and vegetables, whole grains, soybeans, eggs, lean meat, and low-fat dairy.  Avoid alcoholic and caffeinated beverages, as well as spicy foods.   This may help prevent hot flashes.  Get 7-8 hours of sleep each night.  Dress in layers that can be removed to help you manage hot flashes.  Find ways to manage stress, such as deep breathing, meditation, or journaling.   General instructions  Keep track of your menstrual periods, including: ? When they occur. ? How heavy they are and how long they last. ? How much time passes between periods.  Keep track of your symptoms, noting when they start, how often you have them, and how long they last.  Use vaginal lubricants or moisturizers to help with vaginal dryness and improve comfort during sex.  You can still become pregnant if you are having irregular periods. Make sure you use contraception during perimenopause if you do not want to get pregnant.  Keep all follow-up visits. This is important. This includes any group therapy or counseling.   Contact a health care provider if:  You have heavy vaginal bleeding or pass blood clots.  Your period lasts more than 2 days longer  than normal.  Your periods are recurring sooner than 21 days.  You bleed after having sex.  You have pain during sex. Get help right away if you have:  Chest pain, trouble breathing, or trouble talking.  Severe depression.  Pain when you urinate.  Severe headaches.  Vision problems. Summary  Perimenopause is the time when a woman's body begins to move into menopause. This may happen naturally or as a result of other health problems or medical treatments.  Perimenopause can begin 2-8 years before menopause, and it can last for several years.  Perimenopausal symptoms can be managed through medicines, lifestyle changes, and complementary therapies such as acupuncture. This information is not intended to replace advice given to you by your health care provider. Make sure you discuss any questions you have with your health care provider. Document Revised: 05/13/2020 Document Reviewed: 05/13/2020 Elsevier Patient Education  2021 Elsevier Inc.  

## 2021-02-18 ENCOUNTER — Telehealth: Payer: Self-pay | Admitting: *Deleted

## 2021-02-18 DIAGNOSIS — N6321 Unspecified lump in the left breast, upper outer quadrant: Secondary | ICD-10-CM

## 2021-02-18 LAB — CYTOLOGY - PAP
Comment: NEGATIVE
Diagnosis: NEGATIVE
High risk HPV: NEGATIVE

## 2021-02-18 NOTE — Telephone Encounter (Signed)
Patient scheduled at the breast center on 02/25/21 @ 8:40am. Patient informed.

## 2021-02-18 NOTE — Telephone Encounter (Signed)
-----   Message from Romualdo Bolk, MD sent at 02/17/2021 11:48 AM EST ----- Please schedule this patient for diagnostic breast imaging, attention left breast at 1-2 o'clock. She has screening imaging scheduled next week.  Thank you! Noreene Larsson

## 2021-02-25 ENCOUNTER — Ambulatory Visit
Admission: RE | Admit: 2021-02-25 | Discharge: 2021-02-25 | Disposition: A | Discharge status: Home / Self Care | Payer: BC Managed Care – PPO | Source: Ambulatory Visit | Attending: Obstetrics and Gynecology | Admitting: Obstetrics and Gynecology

## 2021-02-25 ENCOUNTER — Other Ambulatory Visit: Payer: Self-pay

## 2021-02-25 DIAGNOSIS — Z1231 Encounter for screening mammogram for malignant neoplasm of breast: Secondary | ICD-10-CM

## 2021-02-25 DIAGNOSIS — N6321 Unspecified lump in the left breast, upper outer quadrant: Secondary | ICD-10-CM

## 2021-06-01 ENCOUNTER — Ambulatory Visit: Payer: BC Managed Care – PPO | Admitting: Obstetrics and Gynecology

## 2021-06-01 ENCOUNTER — Encounter: Payer: Self-pay | Admitting: Obstetrics and Gynecology

## 2021-06-01 ENCOUNTER — Other Ambulatory Visit: Payer: Self-pay

## 2021-06-01 VITALS — BP 120/70

## 2021-06-01 DIAGNOSIS — N6321 Unspecified lump in the left breast, upper outer quadrant: Secondary | ICD-10-CM | POA: Diagnosis not present

## 2021-06-01 NOTE — Progress Notes (Signed)
GYNECOLOGY  VISIT   HPI: 45 y.o.   Married White or Caucasian Not Hispanic or Latino  female   [phone] with Patient's last menstrual period was 05/16/2021 (exact date).   here for   follow up breast exam At the time of her annual exam in 3/22 her breast exam was abnormal as below: in the periphery of the left breast at 1-2 o'clock is a 2 cm, mobile, not tender prominant ridge of tissue. Not felt on the other side. No skin changes.  Diagnostic breast imaging was normal.  She feels the lump is less prominent.   GYNECOLOGIC HISTORY: Patient's last menstrual period was 05/16/2021 (exact date). Contraception:condoms Menopausal hormone therapy:         OB History     Gravida  3   Para  2   Term  2   Preterm      AB  1   Living  2      SAB  1   IAB      Ectopic      Multiple      Live Births  2              Patient Active Problem List   Diagnosis Date Noted   Family history of breast cancer 02/02/2020   Vitamin B12 deficiency 04/30/2018   Paresthesias 04/29/2018   Ureteral calculus, left 04/28/2015    Past Medical History:  Diagnosis Date   Allergy    seasonal   Anxiety    Chronic kidney disease    Depression    History of vertigo    Migraine with aura    Panic attack    Renal calculi    Urine incontinence    Vitamin B12 deficiency 04/30/2018    Past Surgical History:  Procedure Laterality Date   CESAREAN SECTION     CYSTOSCOPY W/ URETERAL STENT PLACEMENT Left 04/29/2015   Procedure: CYSTOSCOPY WITH RETROGRADE PYELOGRAM/LEFT URETEROSCOPY/STONE BASKETRY/ LEFT URETERAL STENT PLACEMENT;  Surgeon: Marcine Matar, MD;  Location: [facility];  Service: Urology;  Laterality: Left;    Current Outpatient Medications  Medication Sig Dispense Refill   [facility] VITAMIN B12 1000 MCG tablet TAKE 1 TABLET BY MOUTH EVERY DAY 90 tablet 3   ibuprofen (ADVIL) 800 MG tablet Take 1 tablet (800 mg total) by mouth every 8 (eight) hours as needed. 30 tablet 1   No current  facility-administered medications for this visit.     ALLERGIES: Patient has no known allergies.  Family History  Problem Relation Age of Onset   Fibromyalgia Mother    Breast cancer Mother 44   Depression Mother    Hypertension Mother    Miscarriages / [place] Mother    Kidney Stones Father    Anuerysm Father        renal    Alcohol abuse Father    Drug abuse Brother     Social History   Socioeconomic History   Marital status: Married    Spouse name: Not on file   Number of children: Not on file   Years of education: Not on file   Highest education level: Not on file  Occupational History   Not on file  Tobacco Use   Smoking status: Never   Smokeless tobacco: Never  Vaping Use   Vaping Use: Never used  Substance and Sexual Activity   Alcohol use: No   Drug use: No   Sexual activity: Yes    Partners: Male  Birth control/protection: Condom  Other Topics Concern   Not on file  Social History Narrative   Not on file   Social Determinants of Health   Financial Resource Strain: Not on file  Food Insecurity: Not on file  Transportation Needs: Not on file  Physical Activity: Not on file  Stress: Not on file  Social Connections: Not on file  Intimate Partner Violence: Not on file    ROS  PHYSICAL EXAMINATION:    BP 120/70 (BP Location: Right Arm, Patient Position: Sitting, Cuff Size: Normal)   LMP 05/16/2021 (Exact Date)     General appearance: alert, cooperative and appears stated age Breasts:  stable 2 cm mobile lump/prominent ridge of tissue  at the periphery of the left breast at1-2 o'clock, not tender. No supraclavicular or axillary adenopathy.    1. Breast lump on left side at 2 o'clock position Stable, negative imaging.  Continue breast self exams F/U next year

## 2021-10-17 ENCOUNTER — Ambulatory Visit (HOSPITAL_BASED_OUTPATIENT_CLINIC_OR_DEPARTMENT_OTHER): Payer: BC Managed Care – PPO | Admitting: Nurse Practitioner

## 2021-10-17 ENCOUNTER — Encounter: Payer: BC Managed Care – PPO | Admitting: Family Medicine

## 2021-10-19 ENCOUNTER — Other Ambulatory Visit: Payer: Self-pay

## 2021-10-19 ENCOUNTER — Encounter: Payer: Self-pay | Admitting: Family

## 2021-10-19 ENCOUNTER — Ambulatory Visit (INDEPENDENT_AMBULATORY_CARE_PROVIDER_SITE_OTHER): Payer: BC Managed Care – PPO | Admitting: Family

## 2021-10-19 VITALS — BP 116/87 | HR 90 | Temp 98.2°F | Ht 67.0 in | Wt 174.8 lb

## 2021-10-19 DIAGNOSIS — Z23 Encounter for immunization: Secondary | ICD-10-CM

## 2021-10-19 DIAGNOSIS — E538 Deficiency of other specified B group vitamins: Secondary | ICD-10-CM | POA: Diagnosis not present

## 2021-10-19 DIAGNOSIS — Z0000 Encounter for general adult medical examination without abnormal findings: Secondary | ICD-10-CM

## 2021-10-19 LAB — CBC WITH DIFFERENTIAL/PLATELET
Basophils Absolute: 0 10*3/uL (ref 0.0–0.1)
Basophils Relative: 0.5 % (ref 0.0–3.0)
Eosinophils Absolute: 0.2 10*3/uL (ref 0.0–0.7)
Eosinophils Relative: 2 % (ref 0.0–5.0)
HCT: 38.5 % (ref 36.0–46.0)
Hemoglobin: 12.6 g/dL (ref 12.0–15.0)
Lymphocytes Relative: 23.2 % (ref 12.0–46.0)
Lymphs Abs: 1.8 10*3/uL (ref 0.7–4.0)
MCHC: 32.7 g/dL (ref 30.0–36.0)
MCV: 86.5 fl (ref 78.0–100.0)
Monocytes Absolute: 0.5 10*3/uL (ref 0.1–1.0)
Monocytes Relative: 6.4 % (ref 3.0–12.0)
Neutro Abs: 5.3 10*3/uL (ref 1.4–7.7)
Neutrophils Relative %: 67.9 % (ref 43.0–77.0)
Platelets: 243 10*3/uL (ref 150.0–400.0)
RBC: 4.45 Mil/uL (ref 3.87–5.11)
RDW: 13.5 % (ref 11.5–15.5)
WBC: 7.8 10*3/uL (ref 4.0–10.5)

## 2021-10-19 LAB — COMPREHENSIVE METABOLIC PANEL
ALT: 12 U/L (ref 0–35)
AST: 14 U/L (ref 0–37)
Albumin: 4.4 g/dL (ref 3.5–5.2)
Alkaline Phosphatase: 55 U/L (ref 39–117)
BUN: 12 mg/dL (ref 6–23)
CO2: 30 mEq/L (ref 19–32)
Calcium: 9.6 mg/dL (ref 8.4–10.5)
Chloride: 104 mEq/L (ref 96–112)
Creatinine, Ser: 0.89 mg/dL (ref 0.40–1.20)
GFR: 79.03 mL/min (ref >60.00)
Glucose, Bld: 84 mg/dL (ref 70–99)
Potassium: 4.3 mEq/L (ref 3.5–5.1)
Sodium: 138 mEq/L (ref 135–145)
Total Bilirubin: 0.3 mg/dL (ref 0.2–1.2)
Total Protein: 7.1 g/dL (ref 6.0–8.3)

## 2021-10-19 LAB — LIPID PANEL
Cholesterol: 183 mg/dL (ref 0–200)
HDL: 45.1 mg/dL (ref >39.00)
LDL Cholesterol: 103 mg/dL — ABNORMAL HIGH (ref 0–99)
NonHDL: 137.43
Total CHOL/HDL Ratio: 4
Triglycerides: 174 mg/dL — ABNORMAL HIGH (ref 0.0–149.0)
VLDL: 34.8 mg/dL (ref 0.0–40.0)

## 2021-10-19 LAB — VITAMIN B12: Vitamin B-12: 314 pg/mL (ref 211–911)

## 2021-10-19 LAB — TSH: TSH: 1.46 u[IU]/mL (ref 0.35–5.50)

## 2021-10-19 NOTE — Progress Notes (Signed)
Phone [phone]   Subjective:   Patient is a 45 y.o. female  presenting for annual physical.    Chief Complaint  Patient presents with   Annual Exam    No concerns today.   Vitamin B12 Deficiency   Family History of Breast cancer    See problem oriented charting- ROS- full  review of systems was completed and negative.   The following were reviewed and entered/updated in epic: Past Medical History:  Diagnosis Date   Allergy    seasonal   Anxiety    Chronic kidney disease    Depression    History of vertigo    Migraine with aura    Panic attack    Renal calculi    Urine incontinence    Vitamin B12 deficiency 04/30/2018   Patient Active Problem List   Diagnosis Date Noted   Need for immunization against influenza 10/19/2021   Annual physical exam 10/19/2021   Family history of breast cancer 02/02/2020   Vitamin B12 deficiency 04/30/2018   Paresthesias 04/29/2018   Ureteral calculus, left 04/28/2015   Past Surgical History:  Procedure Laterality Date   CESAREAN SECTION     CYSTOSCOPY W/ URETERAL STENT PLACEMENT Left 04/29/2015   Procedure: CYSTOSCOPY WITH RETROGRADE PYELOGRAM/LEFT URETEROSCOPY/STONE BASKETRY/ LEFT URETERAL STENT PLACEMENT;  Surgeon: Marcine Matar, MD;  Location: [facility];  Service: Urology;  Laterality: Left;    Family History  Problem Relation Age of Onset   Fibromyalgia Mother    Breast cancer Mother 26   Depression Mother    Hypertension Mother    Miscarriages / [place] Mother    Kidney Stones Father    Anuerysm Father        renal    Alcohol abuse Father    Drug abuse Brother     Medications- reviewed and updated Current Outpatient Medications  Medication Sig Dispense Refill   [facility] VITAMIN B12 1000 MCG tablet TAKE 1 TABLET BY MOUTH EVERY DAY 90 tablet 3   No current facility-administered medications for this visit.    Allergies-reviewed and updated No Known Allergies  Social History   Social History Narrative   Not  on file   Objective  Objective:  BP 116/87   Pulse 90   Temp 98.2 F (36.8 C) (Temporal)   Ht 5\' 7"  (1.702 m)   Wt 174 lb 12.8 oz (79.3 kg)   LMP 10/07/2021 (Exact Date)   SpO2 98%   BMI 27.38 kg/m  Gen: NAD, resting comfortably HEENT: Mucous membranes are moist. Oropharynx normal Neck: no thyromegaly CV: RRR no murmurs rubs or gallops Lungs: CTAB no crackles, wheeze, rhonchi Abdomen: soft/nontender/nondistended/normal bowel sounds. No rebound or guarding.  Ext: no edema Skin: warm, dry Neuro: grossly normal, moves all extremities, PERRLA   Assessment and Plan   Health Maintenance counseling: 1. Anticipatory guidance: Patient counseled regarding regular dental exams q6 months, eye exams,  avoiding smoking and second hand smoke, limiting alcohol to 1 beverage per day, no illicit drugs.   2. Risk factor reduction:  Advised patient of need for regular exercise and diet rich and fruits and vegetables to reduce risk of heart attack and stroke. Exercise- 3-4d/week. Diet- well balanced.  Wt Readings from Last 3 Encounters:  10/19/21 174 lb 12.8 oz (79.3 kg)  02/17/21 165 lb (74.8 kg)  11/30/20 171 lb (77.6 kg)   3. Immunizations/screenings/ancillary studies Immunization History  Administered Date(s) Administered   Influenza,inj,Quad PF,6+ Mos 10/03/2019, 10/13/2020, 10/19/2021   PFIZER(Purple Top)SARS-COV-2 Vaccination 03/26/2020, 04/16/2020,  03/04/2021   Health Maintenance Due  Topic Date Due   TETANUS/TDAP  Never done   COVID-19 Vaccine (4 - Booster for Pfizer series) 04/29/2021   4. Cervical cancer screening- 2022 5. Breast cancer screening-  mammogram 2022 6. Colon cancer screening - N/A 7. Skin cancer screening- advised regular sunscreen use. Denies worrisome, changing, or new skin lesions.  8. Birth control/STD check- condoms, N/A 9. Osteoporosis screening- N/A 10. Smoking associated screening - non- smoker   Recommended follow up: Return for Complete physical  w/fasting labs. No future appointments.  Lab/Order associations:non fasting   ICD-10-CM   1. Vitamin B12 deficiency  E53.8 Vitamin B12    2. Annual physical exam  Z00.00 Comprehensive metabolic panel    TSH    Lipid panel    CBC with Differential/Platelet    3. Need for immunization against influenza  Z23 Flu Vaccine QUAD 12mo+IM (Fluarix, Fluzone & Alfiuria Quad PF)      No orders of the defined types were placed in this encounter.   Return precautions advised.  Dulce Sellar, NP

## 2021-10-19 NOTE — Assessment & Plan Note (Signed)
All health maintenance items UTD. Husband had vasectomy reversal, pt does not want any hormonal BC, feels she is perimenopausal with emotional fluidity, mother started menopause at 22.

## 2021-10-19 NOTE — Patient Instructions (Signed)
It was very nice to meet you today! ° ° ° °PLEASE NOTE: ° °If you had any lab tests please let us know if you have not heard back within a few days. You may see your results on MyChart before we have a chance to review them but we will give you a call once they are reviewed by us. If we ordered any referrals today, please let us know if you have not heard from their office within the next week.  ° °Please try these tips to maintain a healthy lifestyle: ° °Eat most of your calories during the day when you are active. Eliminate processed foods including packaged sweets (pies, cakes, cookies), reduce intake of potatoes, white bread, white pasta, and white rice. Look for whole grain options, oat flour or almond flour. ° °Each meal should contain half fruits/vegetables, one quarter protein, and one quarter carbs (no bigger than a computer mouse). ° °Cut down on sweet beverages. This includes juice, soda, and sweet tea. Also watch fruit intake, though this is a healthier sweet option, it still contains natural sugar! Limit to 3 servings daily. ° °Drink at least 1 glass of water with each meal and aim for at least 8 glasses per day ° °Exercise at least 150 minutes every week.  ° °

## 2021-12-14 ENCOUNTER — Encounter: Payer: Self-pay | Admitting: Family

## 2021-12-15 NOTE — Telephone Encounter (Signed)
Please schedule appointment with the pt.  Thank You! 

## 2021-12-15 NOTE — Telephone Encounter (Signed)
Pt scheduled  

## 2021-12-16 ENCOUNTER — Other Ambulatory Visit: Payer: Self-pay

## 2021-12-16 ENCOUNTER — Ambulatory Visit: Payer: BC Managed Care – PPO | Admitting: Family

## 2021-12-16 ENCOUNTER — Encounter: Payer: Self-pay | Admitting: Family

## 2021-12-16 VITALS — BP 123/74 | HR 75 | Temp 97.9°F | Ht 67.0 in | Wt 174.8 lb

## 2021-12-16 DIAGNOSIS — M79622 Pain in left upper arm: Secondary | ICD-10-CM | POA: Diagnosis not present

## 2021-12-16 DIAGNOSIS — E538 Deficiency of other specified B group vitamins: Secondary | ICD-10-CM

## 2021-12-16 MED ORDER — CYANOCOBALAMIN 1000 MCG PO TABS
1000.0000 ug | ORAL_TABLET | Freq: Every day | ORAL | 3 refills | Status: AC
Start: 2021-12-16 — End: ?

## 2021-12-16 NOTE — Assessment & Plan Note (Signed)
started after physical and received flu shot in same arm, can not palpate, no swollen lymph nodes, no erythema. reports pain as a dull ache, lasting seconds, r/t random movements, nothing consistent. had U/S and mammogram in 2022, pt has a fibrocystic spot in left breast that is being monitored, but has not caused her pain in past. Scheduled for f/u imaging in March, advised having them focus on this area of concern. If she feels pain is worsening or lasting longer each episode let me know and we can order testing sooner.

## 2021-12-16 NOTE — Progress Notes (Signed)
Subjective:     Patient ID: Diamond Hodges, female    DOB: 05-18-1976, 46 y.o.   MRN: [id number]  Chief Complaint  Patient presents with   Arm pit    Pt says that she had a flu vaccine 10/19/2021, and does not know if pain is related. She has taken OTC Ibuprofen and Tylenol. She took Baclofen last night, and it helped.     HPI: Axilla pain: left side, pt describes as deep, no palpable lump, no redness. pt states pain feels like a dull ache, lasts seconds and goes away. pain triggered by random movements, but does not recall any injury or overuse activity.  Health Maintenance Due  Topic Date Due   TETANUS/TDAP  Never done    Past Medical History:  Diagnosis Date   Allergy    seasonal   Anxiety    Chronic kidney disease    Depression    History of vertigo    Migraine with aura    Panic attack    Renal calculi    Urine incontinence    Vitamin B12 deficiency 04/30/2018    Past Surgical History:  Procedure Laterality Date   CESAREAN SECTION     CYSTOSCOPY W/ URETERAL STENT PLACEMENT Left 04/29/2015   Procedure: CYSTOSCOPY WITH RETROGRADE PYELOGRAM/LEFT URETEROSCOPY/STONE BASKETRY/ LEFT URETERAL STENT PLACEMENT;  Surgeon: Marcine Matar, MD;  Location: [facility];  Service: Urology;  Laterality: Left;    Outpatient Medications Prior to Visit  Medication Sig Dispense Refill   [facility] VITAMIN B12 1000 MCG tablet TAKE 1 TABLET BY MOUTH EVERY DAY 90 tablet 3   No facility-administered medications prior to visit.    No Known Allergies      Objective:    Physical Exam Vitals and nursing note reviewed.  Constitutional:      Appearance: Normal appearance.  Cardiovascular:     Rate and Rhythm: Normal rate and regular rhythm.  Pulmonary:     Effort: Pulmonary effort is normal.     Breath sounds: Normal breath sounds.  Musculoskeletal:        General: Normal range of motion.     Left upper arm: No swelling or tenderness.  Lymphadenopathy:     Upper Body:     Left  upper body: No axillary adenopathy.  Skin:    General: Skin is warm and dry.  Neurological:     Mental Status: She is alert.  Psychiatric:        Mood and Affect: Mood normal.        Behavior: Behavior normal.    BP 123/74    Pulse 75    Temp 97.9 F (36.6 C) (Temporal)    Ht 5\' 7"  (1.702 m)    Wt 174 lb 12.8 oz (79.3 kg)    BMI 27.38 kg/m  Wt Readings from Last 3 Encounters:  12/16/21 174 lb 12.8 oz (79.3 kg)  10/19/21 174 lb 12.8 oz (79.3 kg)  02/17/21 165 lb (74.8 kg)       Assessment & Plan:   Problem List Items Addressed This Visit       Other   Vitamin B12 deficiency   Relevant Medications   cyanocobalamin ([facility] VITAMIN B12) 1000 MCG tablet   Axillary pain, left - Primary    started after physical and received flu shot in same arm, can not palpate, no swollen lymph nodes, no erythema. reports pain as a dull ache, lasting seconds, r/t random movements, nothing consistent. had U/S and mammogram in  2022, pt has a fibrocystic spot in left breast that is being monitored, but has not caused her pain in past. Scheduled for f/u imaging in March, advised having them focus on this area of concern. If she feels pain is worsening or lasting longer each episode let me know and we can order testing sooner.

## 2022-01-10 ENCOUNTER — Other Ambulatory Visit: Payer: Self-pay | Admitting: Obstetrics and Gynecology

## 2022-01-10 DIAGNOSIS — Z1231 Encounter for screening mammogram for malignant neoplasm of breast: Secondary | ICD-10-CM

## 2022-02-17 NOTE — Progress Notes (Unsigned)
46 y.o. [id number] Married White or Caucasian Not Hispanic or Latino female here for annual exam.      No LMP recorded.          Sexually active: {yes no:314532}  The current method of family planning is {contraception:315051}.    Exercising: {yes no:314532}  {types:19826} Smoker:  no  Health Maintenance: Pap:  02-17-21 normal Neg HPV History of abnormal Pap:  no MMG:  02-25-21 normal BMD:   N/A Colonoscopy: N/A TDaP:  2013 Gardasil: No   reports that she has never smoked. She has never used smokeless tobacco. She reports that she does not drink alcohol and does not use drugs.  Past Medical History:  Diagnosis Date   Allergy    seasonal   Anxiety    Chronic kidney disease    Depression    History of vertigo    Migraine with aura    Panic attack    Renal calculi    Urine incontinence    Vitamin B12 deficiency 04/30/2018    Past Surgical History:  Procedure Laterality Date   CESAREAN SECTION     CYSTOSCOPY W/ URETERAL STENT PLACEMENT Left 04/29/2015   Procedure: CYSTOSCOPY WITH RETROGRADE PYELOGRAM/LEFT URETEROSCOPY/STONE BASKETRY/ LEFT URETERAL STENT PLACEMENT;  Surgeon: Marcine Matar, MD;  Location: [facility];  Service: Urology;  Laterality: Left;    Current Outpatient Medications  Medication Sig Dispense Refill   cyanocobalamin ([facility] VITAMIN B12) 1000 MCG tablet Take 1 tablet (1,000 mcg total) by mouth daily. 90 tablet 3   No current facility-administered medications for this visit.    Family History  Problem Relation Age of Onset   Fibromyalgia Mother    Breast cancer Mother 35   Depression Mother    Hypertension Mother    Miscarriages / [place] Mother    Kidney Stones Father    Anuerysm Father        renal    Alcohol abuse Father    Drug abuse Brother     Review of Systems  Exam:   There were no vitals taken for this visit.  Weight change: @WEIGHTCHANGE @ Height:      Ht Readings from Last 3 Encounters:  12/16/21 5\' 7"  (1.702 m)  10/19/21 5\' 7"   (1.702 m)  02/17/21 5\' 7"  (1.702 m)    General appearance: alert, cooperative and appears stated age Head: Normocephalic, without obvious abnormality, atraumatic Neck: no adenopathy, supple, symmetrical, trachea midline and thyroid {CHL AMB PHY EX THYROID NORM DEFAULT:[phone]::"normal to inspection and palpation"} Lungs: clear to auscultation bilaterally Cardiovascular: regular rate and rhythm Breasts: {Exam; breast:13139::"normal appearance, no masses or tenderness"} Abdomen: soft, non-tender; non distended,  no masses,  no organomegaly Extremities: extremities normal, atraumatic, no cyanosis or edema Skin: Skin color, texture, turgor normal. No rashes or lesions Lymph nodes: Cervical, supraclavicular, and axillary nodes normal. No abnormal inguinal nodes palpated Neurologic: Grossly normal   Pelvic: External genitalia:  no lesions              Urethra:  normal appearing urethra with no masses, tenderness or lesions              Bartholins and Skenes: normal                 Vagina: normal appearing vagina with normal color and discharge, no lesions              Cervix: {CHL AMB PHY EX CERVIX NORM DEFAULT:[id number]::"no lesions"}  Bimanual Exam:  Uterus:  {CHL AMB PHY EX UTERUS NORM DEFAULT:[phone]::"normal size, contour, position, consistency, mobility, non-tender"}              Adnexa: {CHL AMB PHY EX ADNEXA NO MASS DEFAULT:[id number]::"no mass, fullness, tenderness"}               Rectovaginal: Confirms               Anus:  normal sphincter tone, no lesions  *** chaperoned for the exam.  A:  Well Woman with normal exam  P:

## 2022-03-03 ENCOUNTER — Other Ambulatory Visit: Payer: Self-pay

## 2022-03-03 ENCOUNTER — Encounter: Payer: Self-pay | Admitting: Obstetrics and Gynecology

## 2022-03-03 ENCOUNTER — Ambulatory Visit (INDEPENDENT_AMBULATORY_CARE_PROVIDER_SITE_OTHER): Payer: BC Managed Care – PPO | Admitting: Obstetrics and Gynecology

## 2022-03-03 VITALS — BP 120/82 | HR 68 | Resp 16 | Ht 66.25 in | Wt 178.0 lb

## 2022-03-03 DIAGNOSIS — M79622 Pain in left upper arm: Secondary | ICD-10-CM

## 2022-03-03 DIAGNOSIS — Z01419 Encounter for gynecological examination (general) (routine) without abnormal findings: Secondary | ICD-10-CM

## 2022-03-03 DIAGNOSIS — R4586 Emotional lability: Secondary | ICD-10-CM | POA: Diagnosis not present

## 2022-03-03 NOTE — Patient Instructions (Signed)

## 2022-03-08 ENCOUNTER — Ambulatory Visit
Admission: RE | Admit: 2022-03-08 | Discharge: 2022-03-08 | Disposition: A | Discharge status: Home / Self Care | Payer: BC Managed Care – PPO | Source: Ambulatory Visit

## 2022-03-08 ENCOUNTER — Other Ambulatory Visit: Payer: Self-pay

## 2022-03-08 DIAGNOSIS — Z1231 Encounter for screening mammogram for malignant neoplasm of breast: Secondary | ICD-10-CM

## 2022-05-15 ENCOUNTER — Encounter: Payer: Self-pay | Admitting: Obstetrics and Gynecology

## 2022-05-15 DIAGNOSIS — R4586 Emotional lability: Secondary | ICD-10-CM

## 2022-05-15 MED ORDER — SERTRALINE HCL 50 MG PO TABS: ORAL_TABLET | ORAL | 1 refills | Status: DC

## 2022-06-06 ENCOUNTER — Other Ambulatory Visit: Payer: Self-pay | Admitting: Obstetrics and Gynecology

## 2022-06-06 DIAGNOSIS — R4586 Emotional lability: Secondary | ICD-10-CM

## 2022-06-12 ENCOUNTER — Ambulatory Visit: Payer: BC Managed Care – PPO | Admitting: Obstetrics and Gynecology

## 2022-06-14 ENCOUNTER — Telehealth: Payer: Self-pay | Admitting: *Deleted

## 2022-06-14 NOTE — Telephone Encounter (Signed)
Patient called stating her medication recheck visit was rescheduled from 06/16/22 to 07/03/22. Patient asked if refill on previous Rx.  She does have 1 refill on Rx, I checked with pharmacy. I called patient and left detailed message on voicemail refill is there.

## 2022-06-15 ENCOUNTER — Ambulatory Visit: Payer: BC Managed Care – PPO | Admitting: Obstetrics and Gynecology

## 2022-06-16 ENCOUNTER — Ambulatory Visit: Payer: BC Managed Care – PPO | Admitting: Obstetrics and Gynecology

## 2022-06-22 ENCOUNTER — Encounter: Payer: Self-pay | Admitting: Obstetrics and Gynecology

## 2022-06-22 ENCOUNTER — Ambulatory Visit: Payer: BC Managed Care – PPO | Admitting: Obstetrics and Gynecology

## 2022-06-22 VITALS — BP 128/74 | Wt 172.0 lb

## 2022-06-22 DIAGNOSIS — K6289 Other specified diseases of anus and rectum: Secondary | ICD-10-CM | POA: Diagnosis not present

## 2022-06-22 DIAGNOSIS — R102 Pelvic and perineal pain: Secondary | ICD-10-CM

## 2022-06-22 LAB — PREGNANCY, URINE: Preg Test, Ur: NEGATIVE

## 2022-06-22 MED ORDER — DILTIAZEM GEL 2 %: CUTANEOUS | 1 refills | Status: DC

## 2022-06-22 NOTE — Progress Notes (Signed)
GYNECOLOGY  VISIT   HPI: 46 y.o.   Married White or Caucasian Not Hispanic or Latino  female   [phone] with No LMP recorded.   here for pelvic pain. She states that she is having sharp pain in her pelvis that feels like a knife. She states that she woke up in the night with it and has taken 600 mg of Ibuprofen 2 x.  For the last 6-12 months, she intermittently has anal cramps during her cycle only. Feels spastic. Typically lasts for <24 hours. 5/10 in severity. Helped with ibuprofen and heat. That same sensation started 5 days ago, this is the first time it happened without her cycle. It lasted x 4-5 hours. Then returned 24 hours later, intermittent x 24 hours. Last night it started at 5-6 pm, stayed all night. She took 3 ibuprofen this am which helped. She also took a muscle relaxer this am. At times the pain is in her pelvis.  No pain with defecation.   She has some issues with constipation. Typically has a BM daily, but can go 2-3 days. Recently BM's every 2-3 days. Occasionally she has blood after straining (not for 6 months).  With her cycles, cramps are intermittently bad.  LMP ~2.5-3 weeks. Cycles have been every 26-40 days. Condoms for contraception.   H/O migraine with aura, can't take OCP's.   GYNECOLOGIC HISTORY: No LMP recorded. Contraception:condoms  Menopausal hormone therapy: none         OB History     Gravida  3   Para  2   Term  2   Preterm      AB  1   Living  2      SAB  1   IAB      Ectopic      Multiple      Live Births  2              Patient Active Problem List   Diagnosis Date Noted   Axillary pain, left 12/16/2021   Need for immunization against influenza 10/19/2021   Annual physical exam 10/19/2021   Family history of breast cancer 02/02/2020   Vitamin B12 deficiency 04/30/2018   Paresthesias 04/29/2018   Ureteral calculus, left 04/28/2015    Past Medical History:  Diagnosis Date   Allergy    seasonal   Anxiety     Chronic kidney disease    Depression    History of vertigo    Migraine with aura    Panic attack    Renal calculi    Urine incontinence    Vitamin B12 deficiency 04/30/2018    Past Surgical History:  Procedure Laterality Date   CESAREAN SECTION     CYSTOSCOPY W/ URETERAL STENT PLACEMENT Left 04/29/2015   Procedure: CYSTOSCOPY WITH RETROGRADE PYELOGRAM/LEFT URETEROSCOPY/STONE BASKETRY/ LEFT URETERAL STENT PLACEMENT;  Surgeon: Marcine Matar, MD;  Location: [facility];  Service: Urology;  Laterality: Left;    Current Outpatient Medications  Medication Sig Dispense Refill   cyanocobalamin ([facility] VITAMIN B12) 1000 MCG tablet Take 1 tablet (1,000 mcg total) by mouth daily. 90 tablet 3   sertraline (ZOLOFT) 50 MG tablet Take 1/2 a tablet a day for one week. If tolerating, then increase to one tablet a day. 30 tablet 1   No current facility-administered medications for this visit.     ALLERGIES: Patient has no known allergies.  Family History  Problem Relation Age of Onset   Fibromyalgia Mother    Breast cancer  Mother 59   Depression Mother    Hypertension Mother    Miscarriages / [place] Mother    Kidney Stones Father    Anuerysm Father        renal    Alcohol abuse Father    Drug abuse Brother     Social History   Socioeconomic History   Marital status: Married    Spouse name: Not on file   Number of children: Not on file   Years of education: Not on file   Highest education level: Not on file  Occupational History   Not on file  Tobacco Use   Smoking status: Never   Smokeless tobacco: Never  Vaping Use   Vaping Use: Never used  Substance and Sexual Activity   Alcohol use: No   Drug use: No   Sexual activity: Yes    Partners: Male    Birth control/protection: Condom  Other Topics Concern   Not on file  Social History Narrative   Not on file   Social Determinants of Health   Financial Resource Strain: Not on file  Food Insecurity: Not on file   Transportation Needs: Not on file  Physical Activity: Not on file  Stress: Not on file  Social Connections: Not on file  Intimate Partner Violence: Not on file    Review of Systems  All other systems reviewed and are negative.   PHYSICAL EXAMINATION:    BP 128/74   Wt 172 lb (78 kg)   BMI 27.55 kg/m     General appearance: alert, cooperative and appears stated age Abdomen: soft, non-tender; non distended, no masses,  no organomegaly  Pelvic: External genitalia:  no lesions              Urethra:  normal appearing urethra with no masses, tenderness or lesions              Bartholins and Skenes: normal                 Vagina: normal appearing vagina with normal color and discharge, no lesions              Cervix: no cervical motion tenderness and no lesions              Bimanual Exam:  Uterus:  normal size, contour, position, consistency, mobility, non-tender              Adnexa: no mass, fullness, tenderness              Rectovaginal: Yes.  .  Confirms.              Anus:  normal sphincter tone, no lesions  Pelvic floor: not tender  Bladder: not tender  Chaperone was present for exam.  1. Pelvic pain Intermittent, sometimes associated with rectal pain, intermittent dysmenorrhea - Pregnancy, urine - US PELVIS TRANSVAGINAL NON-OB (TV ONLY); Future  2. Rectal pain Not classic for rectal spasms, typically occurring with her cycle. Not a candidate for OCP's.  - diltiazem 2 % GEL; Use a pea sized amount topically up to three times a day as needed.  Dispense: 30 g; Refill: 1 - US PELVIS TRANSVAGINAL NON-OB (TV ONLY); Future -Calendar pain, BM's and cycles -If her ultrasound is negative, I will place a referral to GI

## 2022-06-29 ENCOUNTER — Ambulatory Visit: Payer: BC Managed Care – PPO

## 2022-06-29 DIAGNOSIS — K6289 Other specified diseases of anus and rectum: Secondary | ICD-10-CM

## 2022-06-29 DIAGNOSIS — R102 Pelvic and perineal pain: Secondary | ICD-10-CM

## 2022-07-03 ENCOUNTER — Encounter: Payer: Self-pay | Admitting: Obstetrics and Gynecology

## 2022-07-03 ENCOUNTER — Ambulatory Visit: Payer: BC Managed Care – PPO | Admitting: Obstetrics and Gynecology

## 2022-07-03 VITALS — BP 110/70 | HR 82 | Wt 172.0 lb

## 2022-07-03 DIAGNOSIS — F419 Anxiety disorder, unspecified: Secondary | ICD-10-CM

## 2022-07-03 DIAGNOSIS — N83201 Unspecified ovarian cyst, right side: Secondary | ICD-10-CM

## 2022-07-03 DIAGNOSIS — R102 Pelvic and perineal pain: Secondary | ICD-10-CM | POA: Diagnosis not present

## 2022-07-03 MED ORDER — SERTRALINE HCL 50 MG PO TABS: ORAL_TABLET | ORAL | 2 refills | Status: AC

## 2022-07-03 NOTE — Progress Notes (Signed)
GYNECOLOGY  VISIT   HPI: 46 y.o.   Married White or Caucasian Not Hispanic or Latino  female   [phone] with Patient's last menstrual period was 06/04/2022.   here for medication follow up.   She was started on Zoloft last month for anxiety. She states that she has some intermittent headaches. Otherwise she feels like the Zoloft is helping her. She started with 1/4 of a 50 mg Zoloft, worked up to 1 tablet. She does feel like it is helping.  She has a h/o headaches, thinks they are a little different and more often. She has started to calendar it. Feels the pros outweigh the cons.   She also had an ultrasound last Thursday for a 6-12 month h/o intermittent pelvic pain. Currently her pain is better.  She is calendaring her pain and her diet/BM's. She has cut out gluten.   GYNECOLOGIC HISTORY: Patient's last menstrual period was 06/04/2022. Contraception:condoms Menopausal hormone therapy: none         OB History     Gravida  3   Para  2   Term  2   Preterm      AB  1   Living  2      SAB  1   IAB      Ectopic      Multiple      Live Births  2              Patient Active Problem List   Diagnosis Date Noted   Axillary pain, left 12/16/2021   Need for immunization against influenza 10/19/2021   Annual physical exam 10/19/2021   Family history of breast cancer 02/02/2020   Vitamin B12 deficiency 04/30/2018   Paresthesias 04/29/2018   Ureteral calculus, left 04/28/2015    Past Medical History:  Diagnosis Date   Allergy    seasonal   Anxiety    Chronic kidney disease    Depression    History of vertigo    Migraine with aura    Panic attack    Renal calculi    Urine incontinence    Vitamin B12 deficiency 04/30/2018    Past Surgical History:  Procedure Laterality Date   CESAREAN SECTION     CYSTOSCOPY W/ URETERAL STENT PLACEMENT Left 04/29/2015   Procedure: CYSTOSCOPY WITH RETROGRADE PYELOGRAM/LEFT URETEROSCOPY/STONE BASKETRY/ LEFT URETERAL STENT  PLACEMENT;  Surgeon: Marcine Matar, MD;  Location: [facility];  Service: Urology;  Laterality: Left;    Current Outpatient Medications  Medication Sig Dispense Refill   cyanocobalamin ([facility] VITAMIN B12) 1000 MCG tablet Take 1 tablet (1,000 mcg total) by mouth daily. 90 tablet 3   sertraline (ZOLOFT) 50 MG tablet Take 1/2 a tablet a day for one week. If tolerating, then increase to one tablet a day. 30 tablet 1   No current facility-administered medications for this visit.     ALLERGIES: Patient has no known allergies.  Family History  Problem Relation Age of Onset   Fibromyalgia Mother    Breast cancer Mother 68   Depression Mother    Hypertension Mother    Miscarriages / [place] Mother    Kidney Stones Father    Anuerysm Father        renal    Alcohol abuse Father    Drug abuse Brother     Social History   Socioeconomic History   Marital status: Married    Spouse name: Not on file   Number of children: Not on file  Years of education: Not on file   Highest education level: Not on file  Occupational History   Not on file  Tobacco Use   Smoking status: Never   Smokeless tobacco: Never  Vaping Use   Vaping Use: Never used  Substance and Sexual Activity   Alcohol use: No   Drug use: No   Sexual activity: Yes    Partners: Male    Birth control/protection: Condom  Other Topics Concern   Not on file  Social History Narrative   Not on file   Social Determinants of Health   Financial Resource Strain: Not on file  Food Insecurity: Not on file  Transportation Needs: Not on file  Physical Activity: Not on file  Stress: Not on file  Social Connections: Not on file  Intimate Partner Violence: Not on file    Review of Systems  All other systems reviewed and are negative.   PHYSICAL EXAMINATION:    BP 110/70   Pulse 82   Wt 172 lb (78 kg)   LMP 06/04/2022   SpO2 99%   BMI 27.55 kg/m     General appearance: alert, cooperative and appears stated  age  Ultrasound from last week showed a 2.55 cm complex right ovarian cyst and 2 small intramural myomas.   1. Anxiety Feels better on the Zoloft. She has noticed an increase in headaches, she will track them and let me know if she feels she wants to come off of the Zoloft or change to something else - sertraline (ZOLOFT) 50 MG tablet; Take 1/2 a tablet a day for one week. If tolerating, then increase to one tablet a day.  Dispense: 90 tablet; Refill: 2  2. Pelvic pain U/S with complex right ovarian cyst, ? Hemorrhagic C.L. vs possible endometrioma -She will continue to calendar her pain, diet and BM's - US PELVIS TRANSVAGINAL NON-OB (TV ONLY); Future (in 3 months)  3. Right ovarian cyst See above - US PELVIS TRANSVAGINAL NON-OB (TV ONLY); Future

## 2022-07-04 NOTE — Telephone Encounter (Signed)
Per office note "- US PELVIS TRANSVAGINAL NON-OB (TV ONLY); Future (in 3 months)"

## 2022-08-02 ENCOUNTER — Other Ambulatory Visit: Payer: BC Managed Care – PPO | Admitting: Obstetrics and Gynecology

## 2022-08-02 ENCOUNTER — Other Ambulatory Visit: Payer: BC Managed Care – PPO

## 2022-09-04 ENCOUNTER — Encounter: Payer: Self-pay | Admitting: *Deleted

## 2022-09-20 DIAGNOSIS — F411 Generalized anxiety disorder: Secondary | ICD-10-CM | POA: Insufficient documentation

## 2022-09-29 ENCOUNTER — Encounter: Payer: Self-pay | Admitting: Obstetrics and Gynecology

## 2022-10-03 ENCOUNTER — Other Ambulatory Visit: Payer: BC Managed Care – PPO

## 2022-10-26 ENCOUNTER — Other Ambulatory Visit: Payer: BC Managed Care – PPO

## 2022-10-27 ENCOUNTER — Ambulatory Visit (HOSPITAL_BASED_OUTPATIENT_CLINIC_OR_DEPARTMENT_OTHER): Payer: BC Managed Care – PPO | Admitting: Nurse Practitioner

## 2022-11-23 ENCOUNTER — Encounter: Payer: Self-pay | Admitting: *Deleted

## 2022-12-14 ENCOUNTER — Ambulatory Visit (INDEPENDENT_AMBULATORY_CARE_PROVIDER_SITE_OTHER): Payer: BC Managed Care – PPO

## 2022-12-14 DIAGNOSIS — R102 Pelvic and perineal pain: Secondary | ICD-10-CM

## 2022-12-14 DIAGNOSIS — N83201 Unspecified ovarian cyst, right side: Secondary | ICD-10-CM | POA: Diagnosis not present

## 2022-12-21 ENCOUNTER — Telehealth: Payer: Self-pay | Admitting: Obstetrics and Gynecology

## 2022-12-21 DIAGNOSIS — N83201 Unspecified ovarian cyst, right side: Secondary | ICD-10-CM

## 2022-12-21 NOTE — Telephone Encounter (Signed)
Please let the patient know that she still has a complex cyst in her right ovary. It is smaller than in 7/23, I don't know if this is a new cyst or still the prior one.  I would recommend that she have surveillance of the cyst. I would recommend a f/u u/s in 3-6 months.

## 2022-12-22 NOTE — Telephone Encounter (Signed)
Pt returned call and LVMTCB. I returned call and received VM, LVMTCB.

## 2022-12-22 NOTE — Telephone Encounter (Signed)
LVMTCB

## 2022-12-25 ENCOUNTER — Other Ambulatory Visit: Payer: Self-pay

## 2022-12-25 ENCOUNTER — Ambulatory Visit (HOSPITAL_BASED_OUTPATIENT_CLINIC_OR_DEPARTMENT_OTHER): Payer: BC Managed Care – PPO

## 2022-12-25 DIAGNOSIS — N83201 Unspecified ovarian cyst, right side: Secondary | ICD-10-CM

## 2022-12-25 NOTE — Telephone Encounter (Signed)
I spoke with patient and read her Dr. Gentry Fitz message: Please let the patient know that she still has a complex cyst in her right ovary. It is smaller than in 7/23, I don't know if this is a new cyst or still the prior one.  I would recommend that she have surveillance of the cyst. I would recommend a f/u u/s in 3-6 months.     Patient would like to proceed with scheduling. Order placed and message sent to appt desk.

## 2022-12-27 NOTE — Telephone Encounter (Signed)
FYI. Pt scheduled for 04/24/2023.

## 2023-01-03 ENCOUNTER — Other Ambulatory Visit: Payer: Self-pay | Admitting: Obstetrics and Gynecology

## 2023-01-03 DIAGNOSIS — Z1231 Encounter for screening mammogram for malignant neoplasm of breast: Secondary | ICD-10-CM

## 2023-02-27 NOTE — Progress Notes (Deleted)
47 y.o. [id number] Married White or Caucasian Not Hispanic or Latino female here for annual exam.      No LMP recorded.          Sexually active: {yes no:314532}  The current method of family planning is {contraception:315051}.    Exercising: {yes no:314532}  {types:19826} Smoker:  {YES V2345720  Health Maintenance: Pap:  02-17-21 normal Neg HPV 01/01/17 WNL Hr HPV Neg, History of abnormal Pap:  no MMG:  03/08/22 density B Bi-rads 1 neg  BMD:   n/a Colonoscopy: *** TDaP:  2013, will do with her primary*** Gardasil: No   reports that she has never smoked. She has never used smokeless tobacco. She reports that she does not drink alcohol and does not use drugs.  Past Medical History:  Diagnosis Date   Allergy    seasonal   Anxiety    Chronic kidney disease    Depression    History of vertigo    Migraine with aura    Panic attack    Renal calculi    Urine incontinence    Vitamin B12 deficiency 04/30/2018    Past Surgical History:  Procedure Laterality Date   CESAREAN SECTION     CYSTOSCOPY W/ URETERAL STENT PLACEMENT Left 04/29/2015   Procedure: CYSTOSCOPY WITH RETROGRADE PYELOGRAM/LEFT URETEROSCOPY/STONE BASKETRY/ LEFT URETERAL STENT PLACEMENT;  Surgeon: Franchot Gallo, MD;  Location: [facility];  Service: Urology;  Laterality: Left;    Current Outpatient Medications  Medication Sig Dispense Refill   cyanocobalamin ([facility] VITAMIN B12) 1000 MCG tablet Take 1 tablet (1,000 mcg total) by mouth daily. 90 tablet 3   sertraline (ZOLOFT) 50 MG tablet Take 1/2 a tablet a day for one week. If tolerating, then increase to one tablet a day. 90 tablet 2   No current facility-administered medications for this visit.    Family History  Problem Relation Age of Onset   Fibromyalgia Mother    Breast cancer Mother 68   Depression Mother    Hypertension Mother    Miscarriages / [place] Mother    Kidney Stones Father    Anuerysm Father        renal    Alcohol abuse Father    Drug  abuse Brother     Review of Systems  Exam:   There were no vitals taken for this visit.  Weight change: @WEIGHTCHANGE @ Height:      Ht Readings from Last 3 Encounters:  03/03/22 5' 6.25" (1.683 m)  12/16/21 5\' 7"  (1.702 m)  10/19/21 5\' 7"  (1.702 m)    General appearance: alert, cooperative and appears stated age Head: Normocephalic, without obvious abnormality, atraumatic Neck: no adenopathy, supple, symmetrical, trachea midline and thyroid {CHL AMB PHY EX THYROID NORM [phone]::"normal to inspection and palpation"} Lungs: clear to auscultation bilaterally Cardiovascular: regular rate and rhythm Breasts: {Exam; breast:13139::"normal appearance, no masses or tenderness"} Abdomen: soft, non-tender; non distended,  no masses,  no organomegaly Extremities: extremities normal, atraumatic, no cyanosis or edema Skin: Skin color, texture, turgor normal. No rashes or lesions Lymph nodes: Cervical, supraclavicular, and axillary nodes normal. No abnormal inguinal nodes palpated Neurologic: Grossly normal   Pelvic: External genitalia:  no lesions              Urethra:  normal appearing urethra with no masses, tenderness or lesions              Bartholins and Skenes: normal  Vagina: normal appearing vagina with normal color and discharge, no lesions              Cervix: {CHL AMB PHY EX CERVIX NORM DEFAULT:[phone]::"no lesions"}               Bimanual Exam:  Uterus:  {CHL AMB PHY EX UTERUS NORM DEFAULT:[phone]::"normal size, contour, position, consistency, mobility, non-tender"}              Adnexa: {CHL AMB PHY EX ADNEXA NO MASS DEFAULT:[phone]::"no mass, fullness, tenderness"}               Rectovaginal: Confirms               Anus:  normal sphincter tone, no lesions  *** chaperoned for the exam.  A:  Well Woman with normal exam  P:

## 2023-03-05 ENCOUNTER — Encounter: Payer: Self-pay | Admitting: Obstetrics and Gynecology

## 2023-03-07 ENCOUNTER — Ambulatory Visit: Payer: BC Managed Care – PPO | Admitting: Obstetrics and Gynecology

## 2023-03-12 ENCOUNTER — Ambulatory Visit
Admission: RE | Admit: 2023-03-12 | Discharge: 2023-03-12 | Disposition: A | Discharge status: Home / Self Care | Payer: BC Managed Care – PPO | Source: Ambulatory Visit

## 2023-03-12 DIAGNOSIS — Z1231 Encounter for screening mammogram for malignant neoplasm of breast: Secondary | ICD-10-CM

## 2023-04-24 ENCOUNTER — Other Ambulatory Visit: Payer: BC Managed Care – PPO | Admitting: Obstetrics and Gynecology

## 2023-04-24 ENCOUNTER — Other Ambulatory Visit: Payer: BC Managed Care – PPO

## 2023-05-10 ENCOUNTER — Ambulatory Visit (INDEPENDENT_AMBULATORY_CARE_PROVIDER_SITE_OTHER): Payer: BC Managed Care – PPO | Admitting: Obstetrics and Gynecology

## 2023-05-10 ENCOUNTER — Ambulatory Visit (INDEPENDENT_AMBULATORY_CARE_PROVIDER_SITE_OTHER): Payer: BC Managed Care – PPO

## 2023-05-10 ENCOUNTER — Encounter: Payer: Self-pay | Admitting: Obstetrics and Gynecology

## 2023-05-10 VITALS — BP 112/64 | HR 74 | Ht 67.0 in | Wt 180.0 lb

## 2023-05-10 DIAGNOSIS — Z01419 Encounter for gynecological examination (general) (routine) without abnormal findings: Secondary | ICD-10-CM | POA: Diagnosis not present

## 2023-05-10 DIAGNOSIS — Z23 Encounter for immunization: Secondary | ICD-10-CM

## 2023-05-10 DIAGNOSIS — N83201 Unspecified ovarian cyst, right side: Secondary | ICD-10-CM

## 2023-05-10 DIAGNOSIS — N946 Dysmenorrhea, unspecified: Secondary | ICD-10-CM | POA: Diagnosis not present

## 2023-05-10 DIAGNOSIS — Z3009 Encounter for other general counseling and advice on contraception: Secondary | ICD-10-CM

## 2023-05-10 MED ORDER — NORETHINDRONE 0.35 MG PO TABS: 1.0000 | ORAL_TABLET | Freq: Every day | ORAL | 3 refills | Status: DC

## 2023-05-10 MED ORDER — NAPROXEN SODIUM 550 MG PO TABS: 550.0000 mg | ORAL_TABLET | Freq: Two times a day (BID) | ORAL | 2 refills | Status: DC

## 2023-05-10 NOTE — Progress Notes (Signed)
47 y.o. Diamond Hodges Married White or Caucasian Not Hispanic or Latino female here for annual exam and u/s to f/u on a complex right ovarian cyst.   Cycles have gotten irregular in the last 6 months, she has been cycling every 1-2 months. Last 2 cycles were 37 and 40 days apart. Typical bleeds for 4 days, can saturate an ultra tampon in 3 hours. Cramps are sporadic, can be moderate to severe. She takes ibuprofen which helps. Cramps have gotten worse in the last few years, but stable.  Period Duration (Days): 4 Period Pattern: (!) Irregular Menstrual Flow: Moderate Menstrual Control: Maxi pad Menstrual Control Change Freq (Hours): 3-4 Dysmenorrhea: (!) Moderate Dysmenorrhea Symptoms: Cramping, Headache, Nausea  07/07/22 U/S  Right ovary 2.55 x 1.56 cm, complex, avascular cyst  12/14/22 U/S Right ovary 1.35 x 1.41 cm echogenic, avascular cyst U/S tech reviewed the images and thinks the angle of the images made it look smaller. Thinks it was similar to the 7/23 u/s  Patient's last menstrual period was 04/14/2023.          Sexually active: Yes.    The current method of family planning is rhythm method.    Exercising: Yes.     Walking and yoga  Smoker:  no  Health Maintenance: Pap:   02-17-21 normal Neg HPV; 01/01/17 WNL Hr HPV neg  History of abnormal Pap:  no MMG:  03/13/23 Density B Bi-rads 1 neg  BMD:   n/a Colonoscopy: none, consultation is scheduled.  TDaP:  2013-2014 Gardasil: none    reports that she has never smoked. She has never used smokeless tobacco. She reports that she does not drink alcohol and does not use drugs. 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 She has 2 daughters, 49 and almost 15.   Past Medical History:  Diagnosis Date   Allergy    seasonal   Anxiety    Chronic kidney disease    Depression    History of vertigo    Migraine with aura    Panic attack    Renal calculi    Urine incontinence    Vitamin B12 deficiency 04/30/2018    Past Surgical History:   Procedure Laterality Date   CESAREAN SECTION     CYSTOSCOPY W/ URETERAL STENT PLACEMENT Left 04/29/2015   Procedure: CYSTOSCOPY WITH RETROGRADE PYELOGRAM/LEFT URETEROSCOPY/STONE BASKETRY/ LEFT URETERAL STENT PLACEMENT;  Surgeon: Marcine Matar, MD;  Location: [facility];  Service: Urology;  Laterality: Left;    Current Outpatient Medications  Medication Sig Dispense Refill   cyanocobalamin ([facility] VITAMIN B12) 1000 MCG tablet Take 1 tablet (1,000 mcg total) by mouth daily. 90 tablet 3   sertraline (ZOLOFT) 50 MG tablet Take 1/2 a tablet a day for one week. If tolerating, then increase to one tablet a day. 90 tablet 2   No current facility-administered medications for this visit.    Family History  Problem Relation Age of Onset   Fibromyalgia Mother    Breast cancer Mother 16   Depression Mother    Hypertension Mother    Miscarriages / [place] Mother    Kidney Stones Father    Anuerysm Father        renal    Alcohol abuse Father    Drug abuse Brother     Review of Systems  All other systems reviewed and are negative.  Pelvic ultrasound  Indications: f/u right ovarian cyst  Findings:  Uterus 7.90 x 5.12 x 4.88 cm, anteverted Fibroids: 1) 2.11 x 1.56 cm, posterior,  degenerating 2) 0.67 x 0.58 cm, posterior  Endometrium 3.9 mm, no masses or thickening  Left ovary 3.31 x 1.90 x 1.13 cm 1.06 x 1.04 cm follicle  Right ovary 2.95 x 2.39 x 2.04 cm 2.3 x 1.7 x 1.5 cm avascular cyst with debris  No free fluid  Impression:  Normal sized, anteverted uterus 2 small myomas, one degenerating Thin endometrium without masses Left ovary normal with a 1 cm follicle Right ovary with 2.3 cm avascular cyst with debris (stable compared to 7/23)   Exam:   BP 112/64   Pulse 74   Ht 5\' 7"  (1.702 m)   Wt 180 lb (81.6 kg)   LMP 04/14/2023   SpO2 100%   BMI 28.19 kg/m   Weight change: @WEIGHTCHANGE @ Height:   Height: 5\' 7"  (170.2 cm)  Ht Readings from Last 3 Encounters:   05/10/23 5\' 7"  (1.702 m)  03/03/22 5' 6.25" (1.683 m)  12/16/21 5\' 7"  (1.702 m)    General appearance: alert, cooperative and appears stated age Head: Normocephalic, without obvious abnormality, atraumatic Neck: no adenopathy, supple, symmetrical, trachea midline and thyroid normal to inspection and palpation Lungs: clear to auscultation bilaterally Cardiovascular: regular rate and rhythm Breasts: normal appearance, no masses or tenderness Abdomen: soft, non-tender; non distended,  no masses,  no organomegaly Extremities: extremities normal, atraumatic, no cyanosis or edema Skin: Skin color, texture, turgor normal. No rashes or lesions Lymph nodes: Cervical, supraclavicular, and axillary nodes normal. No abnormal inguinal nodes palpated Neurologic: Grossly normal   Pelvic: External genitalia:  no lesions              Urethra:  normal appearing urethra with no masses, tenderness or lesions              Bartholins and Skenes: normal                 Vagina: normal appearing vagina with normal color and discharge, no lesions              Cervix: no lesions               Bimanual Exam:  Uterus:  normal size, contour, position, consistency, mobility, non-tender              Adnexa: no mass, fullness, tenderness               Rectovaginal: Confirms               Anus:  normal sphincter tone, no lesions  Clydie Braun, CMA chaperoned for the exam.  1. Well woman exam Discussed breast self exam Discussed calcium and vit D intake Mammogram UTD Labs with primary Colonoscopy scheduled  2. Right ovarian cyst Stable 2.3 avascular cyst with debris, suspect endometrioma or possible dermoid. Discussed option of surgery or observation. Will plan on f/u U/S in 1 year. Discussed incredibly small risk of malignant transformation.  - US PELVIS TRANSVAGINAL NON-OB (TV ONLY); Future  3. General counseling and advice on female contraception Not a candidate for OCP's. Will try POP - norethindrone (ORTHO  MICRONOR) 0.35 MG tablet; Take 1 tablet (0.35 mg total) by mouth daily.  Dispense: 84 tablet; Refill: 3  4. Dysmenorrhea - naproxen sodium (ANAPROX DS) 550 MG tablet; Take 1 tablet (550 mg total) by mouth 2 (two) times daily with a meal.  Dispense: 30 tablet; Refill: 2  5. Immunization due - Tdap vaccine greater than or equal to 7yo IM

## 2023-10-11 ENCOUNTER — Other Ambulatory Visit: Payer: Self-pay | Admitting: Podiatry

## 2023-10-11 ENCOUNTER — Encounter: Payer: Self-pay | Admitting: Podiatry

## 2023-10-11 ENCOUNTER — Ambulatory Visit: Payer: BC Managed Care – PPO | Admitting: Podiatry

## 2023-10-11 ENCOUNTER — Ambulatory Visit: Payer: BC Managed Care – PPO

## 2023-10-11 DIAGNOSIS — M79671 Pain in right foot: Secondary | ICD-10-CM

## 2023-10-11 DIAGNOSIS — M2011 Hallux valgus (acquired), right foot: Secondary | ICD-10-CM | POA: Diagnosis not present

## 2023-10-11 DIAGNOSIS — M7731 Calcaneal spur, right foot: Secondary | ICD-10-CM

## 2023-10-11 DIAGNOSIS — M19071 Primary osteoarthritis, right ankle and foot: Secondary | ICD-10-CM | POA: Diagnosis not present

## 2023-10-11 DIAGNOSIS — M7661 Achilles tendinitis, right leg: Secondary | ICD-10-CM | POA: Diagnosis not present

## 2023-10-11 MED ORDER — MELOXICAM 15 MG PO TABS: 15.0000 mg | ORAL_TABLET | Freq: Every day | ORAL | 0 refills | Status: DC

## 2023-10-11 NOTE — Progress Notes (Signed)
  Subjective:  Patient ID: Diamond Hodges, female    DOB: 27-Feb-1976,   MRN: [id number]  Chief Complaint  Patient presents with   Foot Pain    Right heel pain, it is worse in the mornings and the more she exercises, been having the pain for about a month, has been icing it but not sure it has helped and she has changed her shoes    47 y.o. female presents for concern of right foot pain that has been ongoing for about a month. Relates most pain with first steps in the morning and then after being on her feet for a while. She has tried some on clouds that seem to be helping and wears a cushioned pair of shoes that help. Denies any other current treatments for this  . Denies any other pedal complaints. Denies n/v/f/c.   Past Medical History:  Diagnosis Date   Allergy    seasonal   Anxiety    Chronic kidney disease    Depression    History of vertigo    Migraine with aura    Panic attack    Renal calculi    Urine incontinence    Vitamin B12 deficiency 04/30/2018    Objective:  Physical Exam: Vascular: DP/PT pulses 2/4 bilateral. CFT <3 seconds. Normal hair growth on digits. No edema.  Skin. No lacerations or abrasions bilateral feet.  Musculoskeletal: MMT 5/5 bilateral lower extremities in DF, PF, Inversion and Eversion. Deceased ROM in DF of ankle joint. Minimally tender to insertion of achilles medial and lateral. Not much pain ilicited but worse with activity. No pain to medial calcaneal tubercle or arch. No pain in watershed area. No pain with calcaneal squeeze. Restricted DF of the ankle.  Neurological: Sensation intact to light touch.   Assessment:   1. Achilles tendinitis, right leg      Plan:  Patient was evaluated and treated and all questions answered. -Xrays reviewed. 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 No spurring noted.  -Discussed Achilles insertional tendonitis and treatment options with patient.  -Discussed stretching  exercises. -Rx Meloxicam provided. Kidney function labs wnl.  -Heel lifts provided and discussed proper shoewear.  -Discussed if no improvement will consider MRI/PT/EPAT/PRP injections.  -Patient to return to office in 6 weeks for recheck.    Louann Sjogren, DPM

## 2023-10-11 NOTE — Patient Instructions (Signed)

## 2023-10-16 LAB — EXTERNAL GENERIC LAB PROCEDURE: COLOGUARD: NEGATIVE

## 2023-10-16 LAB — COLOGUARD: COLOGUARD: NEGATIVE

## 2023-11-22 ENCOUNTER — Ambulatory Visit: Payer: BC Managed Care – PPO | Admitting: Podiatry

## 2023-12-21 ENCOUNTER — Encounter: Payer: Self-pay | Admitting: Family Medicine

## 2023-12-21 ENCOUNTER — Ambulatory Visit (INDEPENDENT_AMBULATORY_CARE_PROVIDER_SITE_OTHER): Payer: 59 | Admitting: Family Medicine

## 2023-12-21 VITALS — BP 107/68 | HR 104 | Ht 67.0 in | Wt 194.2 lb

## 2023-12-21 DIAGNOSIS — R519 Headache, unspecified: Secondary | ICD-10-CM | POA: Diagnosis not present

## 2023-12-21 DIAGNOSIS — G8929 Other chronic pain: Secondary | ICD-10-CM

## 2023-12-21 DIAGNOSIS — F411 Generalized anxiety disorder: Secondary | ICD-10-CM | POA: Diagnosis not present

## 2023-12-21 DIAGNOSIS — Z711 Person with feared health complaint in whom no diagnosis is made: Secondary | ICD-10-CM | POA: Diagnosis not present

## 2023-12-21 DIAGNOSIS — Z1231 Encounter for screening mammogram for malignant neoplasm of breast: Secondary | ICD-10-CM

## 2023-12-21 DIAGNOSIS — E782 Mixed hyperlipidemia: Secondary | ICD-10-CM

## 2023-12-21 DIAGNOSIS — R7989 Other specified abnormal findings of blood chemistry: Secondary | ICD-10-CM | POA: Insufficient documentation

## 2023-12-21 DIAGNOSIS — H5710 Ocular pain, unspecified eye: Secondary | ICD-10-CM

## 2023-12-21 DIAGNOSIS — E538 Deficiency of other specified B group vitamins: Secondary | ICD-10-CM

## 2023-12-21 DIAGNOSIS — R4189 Other symptoms and signs involving cognitive functions and awareness: Secondary | ICD-10-CM

## 2023-12-21 NOTE — Progress Notes (Signed)
 New patient visit   Patient: Diamond Hodges   DOB: 28-Aug-1976   48 y.o. Female  MRN: [id number] Visit Date: 12/21/2023  Today's healthcare provider: Bernice GORMAN [place], DO   Chief Complaint  Patient presents with   New Patient (Initial Visit)    SUBJECTIVE    Chief Complaint  Patient presents with   New Patient (Initial Visit)   HPI   Pt presents to establish care.   Pmh significant for  B12 deficiency. She is currently on b12 supplements but takes them sporadically.   GAD - on zoloft  50mg   - sees psychiatry and is managed by them for this   She also has a few concerns today  She says she get sharp eye pain randomly that will last a few seconds. Unable to localize as it can happen in both eyes and she cannot find any correlation with this either. She has not yet been to an eye doctor.   Said she had an abnormal t3 thyroid level. Said previous thyroid labs were normal and was told she should have someone follow up on this.   Has also been experiencing headaches for the past few years. Says they used to occur on the right side of temple area but now lately they are more in the base of the skull. She used to see a neurologist and would like to be referred to one again to address this issue. She reports a normal MRI brain in 2020. She also says she has concerns of memory issues as well.    Review of Systems  Constitutional:  Negative for activity change, fatigue and fever.  Respiratory:  Negative for cough and shortness of breath.   Cardiovascular:  Negative for chest pain.  Gastrointestinal:  Negative for abdominal pain.  Genitourinary:  Negative for difficulty urinating.       No outpatient medications have been marked as taking for the 12/21/23 encounter (Office Visit) with [place] Bernice GORMAN, DO.    OBJECTIVE    BP 107/68 (BP Location: Left Arm, Patient Position: Sitting, Cuff Size: Normal)   Pulse (!) 104   Ht 5' 7 (1.702 m)   Wt 194 lb 4 oz (88.1 kg)   SpO2  99%   BMI 30.42 kg/m   Physical Exam Vitals and nursing note reviewed.  Constitutional:      General: She is not in acute distress.    Appearance: Normal appearance.  HENT:     Head: Normocephalic and atraumatic.     Right Ear: Tympanic membrane, ear canal and external ear normal.     Left Ear: Tympanic membrane, ear canal and external ear normal.     Nose: Nose normal.  Eyes:     Conjunctiva/sclera: Conjunctivae normal.  Cardiovascular:     Rate and Rhythm: Normal rate and regular rhythm.  Pulmonary:     Effort: Pulmonary effort is normal.     Breath sounds: Normal breath sounds.  Abdominal:     General: Abdomen is flat. Bowel sounds are normal.     Palpations: Abdomen is soft.  Neurological:     General: No focal deficit present.     Mental Status: She is alert and oriented to person, place, and time.  Psychiatric:        Mood and Affect: Mood normal.        Behavior: Behavior normal.        Thought Content: Thought content normal.        Judgment:  Judgment normal.        ASSESSMENT & PLAN    Problem List Items Addressed This Visit       Other   Vitamin B12 deficiency   Will go ahead and repeat labs since pt is taking supplements sporadically      Relevant Orders   Vitamin B12   Generalized anxiety disorder   On zoloft  and followed by psychiatry and she would like to continue seeing them for this       Abnormal thyroid blood test   Pt had abnormal t3 test in the past and was told to have this repeated in 6 months (which would be around this time) discussed than an abnormal t3 without abnormalities in TSH or t4 does not change treatment management. We will go ahead and order thyroid labs today      Relevant Orders   TSH+T4F+T3Free   Chronic nonintractable headache   Pt has a hx of headaches (that do not sound like migraines on history) however she used to be followed by a neurologist so we will place another referral. Pt also notes some memory issues she  wants to see neurology for as well.      Relevant Orders   Ambulatory referral to Neurology   Mixed hyperlipidemia   Pt also has a hx of hyperlipidemia so we will get lipid panel today      Relevant Orders   Lipid panel   Other Visit Diagnoses       Concern about memory    -  Primary   Relevant Orders   Ambulatory referral to Neurology     Pain in eye, unspecified laterality         Encounter for screening mammogram for malignant neoplasm of breast       Relevant Orders   MM DIGITAL SCREENING BILATERAL       Return in about 3 months (around 03/20/2024).      No orders of the defined types were placed in this encounter.   Orders Placed This Encounter  Procedures   MM DIGITAL SCREENING BILATERAL    Standing Status:   Future    Expiration Date:   12/20/2024    Is the patient pregnant?:   No    Preferred imaging location?:   [facility]    Reason for exam::   screening for breast cancer    Release to patient:   Immediate   Lipid panel    Has the patient fasted?:   No    Release to patient:   Immediate   Vitamin B12   TSH+T4F+T3Free   Ambulatory referral to Neurology    Referral Priority:   Routine    Referral Type:   Consultation    Referral Reason:   Specialty Services Required    Requested Specialty:   Neurology    Number of Visits Requested:   1     Bernice GORMAN [place], DO  [facility] at [facility] [phone] (phone) [phone] (fax)  [facility]

## 2023-12-21 NOTE — Assessment & Plan Note (Signed)
 Pt also has a hx of hyperlipidemia so we will get lipid panel today

## 2023-12-21 NOTE — Assessment & Plan Note (Signed)
 On zoloft and followed by psychiatry and she would like to continue seeing them for this

## 2023-12-21 NOTE — Assessment & Plan Note (Signed)
 Pt has a hx of headaches (that do not sound like migraines on history) however she used to be followed by a neurologist so we will place another referral. Pt also notes some memory issues she wants to see neurology for as well.

## 2023-12-21 NOTE — Assessment & Plan Note (Signed)
 Pt had abnormal t3 test in the past and was told to have this repeated in 6 months (which would be around this time) discussed than an abnormal t3 without abnormalities in TSH or t4 does not change treatment management. We will go ahead and order thyroid labs today

## 2023-12-21 NOTE — Assessment & Plan Note (Signed)
 Will go ahead and repeat labs since pt is taking supplements sporadically

## 2023-12-25 LAB — TSH+T4F+T3FREE
Free T4: 0.8 ng/dL — ABNORMAL LOW (ref 0.82–1.77)
T3, Free: 3.2 pg/mL (ref 2.0–4.4)
TSH: 1.22 u[IU]/mL (ref 0.450–4.500)

## 2023-12-25 LAB — VITAMIN B12: Vitamin B-12: 337 pg/mL (ref 232–1245)

## 2023-12-25 LAB — LIPID PANEL
Chol/HDL Ratio: 5 {ratio} — ABNORMAL HIGH (ref 0.0–4.4)
Cholesterol, Total: 190 mg/dL (ref 100–199)
HDL: 38 mg/dL — ABNORMAL LOW (ref >39)
LDL Chol Calc (NIH): 131 mg/dL — ABNORMAL HIGH (ref 0–99)
Triglycerides: 115 mg/dL (ref 0–149)
VLDL Cholesterol Cal: 21 mg/dL (ref 5–40)

## 2023-12-27 ENCOUNTER — Encounter (INDEPENDENT_AMBULATORY_CARE_PROVIDER_SITE_OTHER): Payer: 59 | Admitting: Family Medicine

## 2023-12-27 DIAGNOSIS — Z711 Person with feared health complaint in whom no diagnosis is made: Secondary | ICD-10-CM

## 2024-01-10 ENCOUNTER — Emergency Department (HOSPITAL_BASED_OUTPATIENT_CLINIC_OR_DEPARTMENT_OTHER)
Admission: EM | Admit: 2024-01-10 | Discharge: 2024-01-10 | Disposition: A | Discharge status: Home / Self Care | Payer: 59 | Attending: Emergency Medicine | Admitting: Emergency Medicine

## 2024-01-10 ENCOUNTER — Other Ambulatory Visit: Payer: Self-pay

## 2024-01-10 ENCOUNTER — Emergency Department (HOSPITAL_BASED_OUTPATIENT_CLINIC_OR_DEPARTMENT_OTHER): Payer: 59

## 2024-01-10 DIAGNOSIS — D72829 Elevated white blood cell count, unspecified: Secondary | ICD-10-CM | POA: Insufficient documentation

## 2024-01-10 DIAGNOSIS — N23 Unspecified renal colic: Secondary | ICD-10-CM

## 2024-01-10 DIAGNOSIS — N132 Hydronephrosis with renal and ureteral calculous obstruction: Secondary | ICD-10-CM | POA: Insufficient documentation

## 2024-01-10 DIAGNOSIS — M545 Low back pain, unspecified: Secondary | ICD-10-CM | POA: Diagnosis present

## 2024-01-10 LAB — CBC WITH DIFFERENTIAL/PLATELET
Abs Immature Granulocytes: 0.09 10*3/uL — ABNORMAL HIGH (ref 0.00–0.07)
Basophils Absolute: 0.1 10*3/uL (ref 0.0–0.1)
Basophils Relative: 1 %
Eosinophils Absolute: 0.1 10*3/uL (ref 0.0–0.5)
Eosinophils Relative: 0 %
HCT: 38.9 % (ref 36.0–46.0)
Hemoglobin: 13.2 g/dL (ref 12.0–15.0)
Immature Granulocytes: 1 %
Lymphocytes Relative: 8 %
Lymphs Abs: 1.3 10*3/uL (ref 0.7–4.0)
MCH: 28.8 pg (ref 26.0–34.0)
MCHC: 33.9 g/dL (ref 30.0–36.0)
MCV: 84.9 fL (ref 80.0–100.0)
Monocytes Absolute: 0.8 10*3/uL (ref 0.1–1.0)
Monocytes Relative: 5 %
Neutro Abs: 13.6 10*3/uL — ABNORMAL HIGH (ref 1.7–7.7)
Neutrophils Relative %: 85 %
Platelets: 245 10*3/uL (ref 150–400)
RBC: 4.58 MIL/uL (ref 3.87–5.11)
RDW: 13.3 % (ref 11.5–15.5)
WBC: 15.9 10*3/uL — ABNORMAL HIGH (ref 4.0–10.5)
nRBC: 0 % (ref 0.0–0.2)

## 2024-01-10 LAB — URINALYSIS, ROUTINE W REFLEX MICROSCOPIC
Bacteria, UA: NONE SEEN
Bilirubin Urine: NEGATIVE
Glucose, UA: NEGATIVE mg/dL
Ketones, ur: NEGATIVE mg/dL
Leukocytes,Ua: NEGATIVE
Nitrite: NEGATIVE
Protein, ur: NEGATIVE mg/dL
Specific Gravity, Urine: 1.011 (ref 1.005–1.030)
pH: 5.5 (ref 5.0–8.0)

## 2024-01-10 LAB — COMPREHENSIVE METABOLIC PANEL
ALT: 11 U/L (ref 0–44)
AST: 14 U/L — ABNORMAL LOW (ref 15–41)
Albumin: 4.7 g/dL (ref 3.5–5.0)
Alkaline Phosphatase: 66 U/L (ref 38–126)
Anion gap: 9 (ref 5–15)
BUN: 9 mg/dL (ref 6–20)
CO2: 25 mmol/L (ref 22–32)
Calcium: 9.2 mg/dL (ref 8.9–10.3)
Chloride: 100 mmol/L (ref 98–111)
Creatinine, Ser: 0.82 mg/dL (ref 0.44–1.00)
GFR, Estimated: >60 mL/min (ref >60)
Glucose, Bld: 98 mg/dL (ref 70–99)
Potassium: 3.8 mmol/L (ref 3.5–5.1)
Sodium: 134 mmol/L — ABNORMAL LOW (ref 135–145)
Total Bilirubin: 0.6 mg/dL (ref 0.0–1.2)
Total Protein: 7.6 g/dL (ref 6.5–8.1)

## 2024-01-10 LAB — PREGNANCY, URINE: Preg Test, Ur: NEGATIVE

## 2024-01-10 MED ORDER — NAPROXEN 500 MG PO TABS: 500.0000 mg | ORAL_TABLET | Freq: Two times a day (BID) | ORAL | 0 refills | Status: AC

## 2024-01-10 MED ORDER — ONDANSETRON 4 MG PO TBDP: 4.0000 mg | ORAL_TABLET | Freq: Three times a day (TID) | ORAL | 0 refills | Status: DC | PRN

## 2024-01-10 MED ORDER — ONDANSETRON HCL 4 MG/2ML IJ SOLN
4.0000 mg | Freq: Once | INTRAMUSCULAR | Status: AC
  Administered 2024-01-10: 4 mg via INTRAVENOUS
  Filled 2024-01-10: qty 2

## 2024-01-10 MED ORDER — KETOROLAC TROMETHAMINE 15 MG/ML IJ SOLN
15.0000 mg | Freq: Once | INTRAMUSCULAR | Status: AC
  Administered 2024-01-10: 15 mg via INTRAVENOUS
  Filled 2024-01-10: qty 1

## 2024-01-10 MED ORDER — TAMSULOSIN HCL 0.4 MG PO CAPS: 0.4000 mg | ORAL_CAPSULE | Freq: Every day | ORAL | 0 refills | Status: DC

## 2024-01-10 MED ORDER — OXYCODONE HCL 5 MG PO TABS: 5.0000 mg | ORAL_TABLET | Freq: Four times a day (QID) | ORAL | 0 refills | Status: DC | PRN

## 2024-01-10 NOTE — ED Provider Notes (Signed)
[place] EMERGENCY DEPARTMENT AT [facility] Provider Note   CSN: [id number] Arrival date & time: 01/10/24  4098     History  Chief Complaint  Patient presents with   Back Pain    Diamond Hodges is a 48 y.o. female.  Patient with history of kidney stone, cesarean section without other surgical history --presents to the emergency department for evaluation of right-sided back pain, low to mid back, over the past several weeks.  Pain has been intermittent.  She thought it was musculoskeletal pain.  Has been treating at home by stretching.  Pain was more severe and persistent in the past 24 hours.  She had associated nausea without vomiting.  She states that is difficult to find a comfortable position.  She has tried Tylenol and Tylenol PM without improvement.  She was able to sleep a bit while using a heating pad to the area.  Rates pain 7 out of 10.  No urinary symptoms.  Pain today is reminiscent of kidney stone pain but may be less severe without vomiting.  No lower extremity weakness.  Pain does not radiate into the lower extremity.  Pain radiates to the right lower quadrant.       Home Medications Prior to Admission medications   Medication Sig Start Date End Date Taking? Authorizing Provider  cyanocobalamin ([employer] VITAMIN B12) 1000 MCG tablet Take 1 tablet (1,000 mcg total) by mouth daily. 12/16/21   Dulce Sellar, NP  sertraline (ZOLOFT) 50 MG tablet Take 1/2 a tablet a day for one week. If tolerating, then increase to one tablet a day. 07/03/22   Romualdo Bolk, MD      Allergies    Patient has no known allergies.    Review of Systems   Review of Systems  Physical Exam Updated Vital Signs BP 138/87 (BP Location: Right Arm)   Pulse 80   Temp 98.6 F (37 C)   Resp 18   SpO2 100%  Physical Exam Vitals and nursing note reviewed.  Constitutional:      Appearance: She is well-developed.  HENT:     Head: Normocephalic and atraumatic.     Mouth/Throat:      Mouth: Mucous membranes are moist.  Eyes:     Conjunctiva/sclera: Conjunctivae normal.  Pulmonary:     Effort: No respiratory distress.  Abdominal:     Tenderness: There is no abdominal tenderness. There is no right CVA tenderness, left CVA tenderness, guarding or rebound.  Musculoskeletal:     Cervical back: Normal range of motion and neck supple.  Skin:    General: Skin is warm and dry.  Neurological:     Mental Status: She is alert.     ED Results / Procedures / Treatments   Labs (all labs ordered are listed, but only abnormal results are displayed) Labs Reviewed  URINALYSIS, ROUTINE W REFLEX MICROSCOPIC - Abnormal; Notable for the following components:      Result Value   Color, Urine COLORLESS (*)    Hgb urine dipstick MODERATE (*)    All other components within normal limits  CBC WITH DIFFERENTIAL/PLATELET - Abnormal; Notable for the following components:   WBC 15.9 (*)    Neutro Abs 13.6 (*)    Abs Immature Granulocytes 0.09 (*)    All other components within normal limits  COMPREHENSIVE METABOLIC PANEL - Abnormal; Notable for the following components:   Sodium 134 (*)    AST 14 (*)    All other components within  normal limits  PREGNANCY, URINE    EKG None  Radiology No results found.  Procedures Procedures    Medications Ordered in ED Medications - No data to display  ED Course/ Medical Decision Making/ A&P    Patient seen and examined. History obtained directly from patient.   Labs/EKG: Ordered CBC, CMP, UA, pregnancy  Imaging: Ordered CT renal protocol  Medications/Fluids: Ordered: IV Toradol  Most recent vital signs reviewed and are as follows: BP 138/87 (BP Location: Right Arm)   Pulse 80   Temp 98.6 F (37 C)   Resp 18   SpO2 100%   Initial impression: Right-sided lower back pain, evaluate for ureteral colic  11:49 AM Reassessment performed. Patient appears comfortable, pain controlled after Toradol.  Remains comfortable on  several rechecks.  Labs personally reviewed and interpreted including: CBC with elevated white blood cell count otherwise unremarkable; CMP with normal kidney function, sodium slightly low at 134 otherwise unremarkable; pregnancy negative; UA moderate blood on dipstick with 0-5 red blood cells per high-power field, no compelling signs of infection.  Imaging personally visualized and interpreted including: CT abdomen pelvis, agree 5-6 mm distal stone with moderate hydronephrosis.  Reviewed pertinent lab work and imaging with patient at bedside. Questions answered.   Most current vital signs reviewed and are as follows: BP 138/86   Pulse 70   Temp 98.6 F (37 C)   Resp 18   SpO2 100%   Plan: Discharge to home.   Prescriptions written for: # 6 tablets oxycodone 5 mg, naproxen, Flomax, Zofran  Patient counseled on kidney stone treatment. Urged patient to strain urine and save any stones. Urged urology follow-up and return with any complications. Counseled patient to maintain good fluid intake.   Counseled patient on use of Flomax.   Patient counseled on use of narcotic pain medications. Counseled not to combine these medications with others containing tylenol. Urged not to drink alcohol, drive, or perform any other activities that requires focus while taking these medications. The patient verbalizes understanding and agrees with the plan.                                  Medical Decision Making Amount and/or Complexity of Data Reviewed Labs: ordered. Radiology: ordered.  Risk Prescription drug management.   For this patient's complaint of abdominal pain, the following conditions were considered on the differential diagnosis: gastritis/PUD, enteritis/duodenitis, appendicitis, cholelithiasis/cholecystitis, cholangitis, pancreatitis, ruptured viscus, colitis, diverticulitis, small/large bowel obstruction, proctitis, cystitis, pyelonephritis, ureteral colic, aortic dissection, aortic  aneurysm. In women, ectopic pregnancy, pelvic inflammatory disease, ovarian cysts, and tubo-ovarian abscess were also considered. Atypical chest etiologies were also considered including ACS, PE, and pneumonia.  CT scan demonstrates right-sided ureteral stone which is consistent with the patient's symptoms today.  The patient's vital signs, pertinent lab work and imaging were reviewed and interpreted as discussed in the ED course. Hospitalization was considered for further testing, treatments, or serial exams/observation. However as patient is well-appearing, has a stable exam, and reassuring studies today, I do not feel that they warrant admission at this time. This plan was discussed with the patient who verbalizes agreement and comfort with this plan and seems reliable and able to return to the Emergency Department with worsening or changing symptoms.          Final Clinical Impression(s) / ED Diagnoses Final diagnoses:  Ureteral colic    Rx / DC Orders ED Discharge Orders  Ordered    naproxen (NAPROSYN) 500 MG tablet  2 times daily        01/10/24 1148    oxyCODONE (OXY IR/ROXICODONE) 5 MG immediate release tablet  Every 6 hours PRN        01/10/24 1148    tamsulosin (FLOMAX) 0.4 MG CAPS capsule  Daily        01/10/24 1148    ondansetron (ZOFRAN-ODT) 4 MG disintegrating tablet  Every 8 hours PRN        01/10/24 1148              Renne Crigler, PA-C 01/10/24 1151    Terald Sleeper, MD 01/10/24 1601

## 2024-01-10 NOTE — Telephone Encounter (Signed)
Please see the MyChart message reply(ies) for my assessment and plan.    This patient gave consent for this Medical Advice Message and is aware that it may result in a bill to [employer], as well as the possibility of receiving a bill for a co-payment or deductible. They are an established patient, but are not seeking medical advice exclusively about a problem treated during an in person or video visit in the last seven days. I did not recommend an in person or video visit within seven days of my reply.    I spent a total of 12 minutes cumulative time within 7 days through [employer].  Spent time looking through patient chart on referral list and sent message to let her know that she can reach out to neurology to herself.   Charlton Amor, DO

## 2024-01-10 NOTE — ED Notes (Signed)
Patient transported to CT

## 2024-01-10 NOTE — Discharge Instructions (Signed)
Please read and follow all provided instructions.  Your diagnoses today include:  1. Ureteral colic     Tests performed today include: Urine test that showed blood in your urine and no infection CT scan which showed a 6 millimeter kidney stone on the right side Blood test that showed normal kidney function Vital signs. See below for your results today.   Medications prescribed:  Oxycodone - narcotic pain medication  DO NOT drive or perform any activities that require you to be awake and alert because this medicine can make you drowsy.   Zofran (ondansetron) - for nausea and vomiting  Flomax (tamsulosin) - relaxes smooth muscle to help kidney stones pass  Naproxen - anti-inflammatory pain medication Do not exceed 500mg  naproxen every 12 hours, take with food  You have been prescribed an anti-inflammatory medication or NSAID. Take with food. Take smallest effective dose for the shortest duration needed for your pain. Stop taking if you experience stomach pain or vomiting.    Take any prescribed medications only as directed.  Home care instructions:  Follow any educational materials contained in this packet.  Please double your fluid intake for the next several days. Strain your urine and save any stones that may pass.   BE VERY CAREFUL not to take multiple medicines containing Tylenol (also called acetaminophen). Doing so can lead to an overdose which can damage your liver and cause liver failure and possibly death.   Follow-up instructions: Please follow-up with your urologist or the urologist referral (provided on front page) in the next 1 week for further evaluation of your symptoms.  Return instructions:  If you need to return to the Emergency Department, go to [facility] and not [facility]. The urologists are located at [facility] and can better care for you at this location.  Please return to the Emergency Department if you experience worsening  symptoms.  Please return if you develop fever or uncontrolled pain or vomiting. Please return if you have any other emergent concerns.  Additional Information:  Your vital signs today were: BP 138/86   Pulse 70   Temp 98.6 F (37 C)   Resp 18   SpO2 100%  If your blood pressure (BP) was elevated above 135/85 this visit, please have this repeated by your doctor within one month. --------------

## 2024-01-10 NOTE — ED Triage Notes (Signed)
Pt reports right lower back pain for 3 weeks that worsened last night, accompanied by nausea.  Denies urinary symptoms. Reports h/o 1 kidney stone prior.

## 2024-01-15 ENCOUNTER — Ambulatory Visit (INDEPENDENT_AMBULATORY_CARE_PROVIDER_SITE_OTHER): Payer: 59 | Admitting: Family Medicine

## 2024-01-15 ENCOUNTER — Encounter: Payer: Self-pay | Admitting: Family Medicine

## 2024-01-15 VITALS — BP 107/73 | HR 77 | Ht 67.0 in | Wt 196.0 lb

## 2024-01-15 DIAGNOSIS — K645 Perianal venous thrombosis: Secondary | ICD-10-CM

## 2024-01-15 NOTE — Patient Instructions (Signed)
Get over the counter preparation H   Try sitz baths  Can try over the counter hydrocortisone cream

## 2024-01-15 NOTE — Assessment & Plan Note (Signed)
Pt presents with thrombosed hemorrhoid. Discussed that she will start to notice some more bleeding - recommended OTC Preparation H, sitz baths, decreasing straining and preventing constipation

## 2024-01-15 NOTE — Progress Notes (Signed)
 Established patient visit   Patient: Diamond Hodges   DOB: 05/15/1976   48 y.o. Female  MRN: [id number] Visit Date: 01/15/2024  Today's healthcare provider: Bernice GORMAN Juneau, DO   No chief complaint on file.   SUBJECTIVE   No chief complaint on file.  HPI   Pt presents with concerns in anal area. Admits to bright red blood in the toilet and on toilet paper.   Review of Systems  Constitutional:  Negative for activity change, fatigue and fever.  Respiratory:  Negative for cough and shortness of breath.   Cardiovascular:  Negative for chest pain.  Gastrointestinal:  Negative for abdominal pain.  Genitourinary:  Negative for difficulty urinating.       Current Meds  Medication Sig   cyanocobalamin  ([employer] VITAMIN B12) 1000 MCG tablet Take 1 tablet (1,000 mcg total) by mouth daily.   naproxen  (NAPROSYN ) 500 MG tablet Take 1 tablet (500 mg total) by mouth 2 (two) times daily.   ondansetron  (ZOFRAN -ODT) 4 MG disintegrating tablet Take 1 tablet (4 mg total) by mouth every 8 (eight) hours as needed for nausea or vomiting.   sertraline  (ZOLOFT ) 50 MG tablet Take 1/2 a tablet a day for one week. If tolerating, then increase to one tablet a day.   tamsulosin  (FLOMAX ) 0.4 MG CAPS capsule Take 1 capsule (0.4 mg total) by mouth daily.    OBJECTIVE    BP 107/73 (BP Location: Left Arm, Patient Position: Sitting, Cuff Size: Large)   Pulse 77   Ht 5' 7 (1.702 m)   Wt 196 lb (88.9 kg)   SpO2 98%   BMI 30.70 kg/m   Physical Exam Vitals and nursing note reviewed.  Constitutional:      General: She is not in acute distress.    Appearance: Normal appearance.  HENT:     Head: Normocephalic and atraumatic.     Right Ear: External ear normal.     Left Ear: External ear normal.     Nose: Nose normal.  Eyes:     Conjunctiva/sclera: Conjunctivae normal.  Cardiovascular:     Rate and Rhythm: Normal rate.  Pulmonary:     Effort: Pulmonary effort is normal.  Genitourinary:     Comments: Chaperone present during rectal exam. Large thrombosed hemorrhoid in 11 oclock area. Neurological:     General: No focal deficit present.     Mental Status: She is alert and oriented to person, place, and time.  Psychiatric:        Mood and Affect: Mood normal.        Behavior: Behavior normal.        Thought Content: Thought content normal.        Judgment: Judgment normal.        ASSESSMENT & PLAN    Problem List Items Addressed This Visit       Cardiovascular and Mediastinum   Thrombosed external hemorrhoid - Primary   Pt presents with thrombosed hemorrhoid. Discussed that she will start to notice some more bleeding - recommended OTC Preparation H, sitz baths, decreasing straining and preventing constipation       Return if symptoms worsen or fail to improve.      No orders of the defined types were placed in this encounter.   No orders of the defined types were placed in this encounter.    Bernice GORMAN Juneau, DO  [facility] at [facility] [phone] (phone) [phone] (fax)  [facility]

## 2024-03-13 ENCOUNTER — Ambulatory Visit
Admission: RE | Admit: 2024-03-13 | Discharge: 2024-03-13 | Disposition: A | Discharge status: Home / Self Care | Payer: 59 | Source: Ambulatory Visit | Attending: Family Medicine

## 2024-03-13 DIAGNOSIS — Z1231 Encounter for screening mammogram for malignant neoplasm of breast: Secondary | ICD-10-CM

## 2024-04-02 ENCOUNTER — Encounter: Payer: Self-pay | Admitting: Neurology

## 2024-04-02 ENCOUNTER — Ambulatory Visit (INDEPENDENT_AMBULATORY_CARE_PROVIDER_SITE_OTHER): Payer: 59 | Admitting: Neurology

## 2024-04-02 VITALS — BP 111/74 | HR 74 | Ht 67.0 in | Wt 198.0 lb

## 2024-04-02 DIAGNOSIS — R519 Headache, unspecified: Secondary | ICD-10-CM

## 2024-04-02 DIAGNOSIS — E66811 Obesity, class 1: Secondary | ICD-10-CM | POA: Diagnosis not present

## 2024-04-02 DIAGNOSIS — Z820 Family history of epilepsy and other diseases of the nervous system: Secondary | ICD-10-CM

## 2024-04-02 DIAGNOSIS — M542 Cervicalgia: Secondary | ICD-10-CM

## 2024-04-02 DIAGNOSIS — Z9189 Other specified personal risk factors, not elsewhere classified: Secondary | ICD-10-CM | POA: Diagnosis not present

## 2024-04-02 DIAGNOSIS — G4486 Cervicogenic headache: Secondary | ICD-10-CM | POA: Diagnosis not present

## 2024-04-02 DIAGNOSIS — R0683 Snoring: Secondary | ICD-10-CM | POA: Diagnosis not present

## 2024-04-02 DIAGNOSIS — G8929 Other chronic pain: Secondary | ICD-10-CM

## 2024-04-02 DIAGNOSIS — R635 Abnormal weight gain: Secondary | ICD-10-CM

## 2024-04-02 NOTE — Patient Instructions (Signed)
 It was nice to meet you today.   As discussed, your headaches are likely due to a combination of factors.   Here is what we discussed today and my recommendations for you:   Please remember, common headache triggers are: sleep deprivation, dehydration, overheating, stress, hypoglycemia or skipping meals and blood sugar fluctuations, excessive pain medications or excessive alcohol use or caffeine withdrawal. Some people have food triggers such as aged cheese, orange juice or chocolate, especially dark chocolate, or MSG (monosodium glutamate). Try to avoid these headache triggers as much possible. It may be helpful to keep a headache diary to figure out what makes your headaches worse or brings them on and what alleviates them. Some people report headache onset after exercise but studies have shown that regular exercise may actually prevent headaches from coming. If you have exercise-induced headaches, please make sure that you drink plenty of fluid before and after exercising and that you do not over do it and do not overheat. Please use ibuprofen  as needed and sparingly. Limit your caffeine to 1-2 servings/day, as caffeine can drive headaches.  I do not see a pressing reason to repeat your brain MRI; it was benign in 2020. I will order a sleep study to look for signs of obstructive sleep apnea (aka OSA). As explained, the long-term risks and ramifications of untreated moderate to severe obstructive sleep apnea may include (but are not limited to): increased risk for cardiovascular disease, including congestive heart failure, stroke, difficult to control hypertension, treatment resistant obesity, arrhythmias, especially irregular heartbeat commonly known as A. Fib. (atrial fibrillation); even type 2 diabetes has been linked to untreated OSA.  For headache prevention, we may consider a daily preventative in the future.  Talk to your PCP about a referral to spine specialist for your chronic neck pain and  about doing neck PT again.  We will plan a follow up in about 4 months.

## 2024-04-02 NOTE — Progress Notes (Signed)
 Subjective:    Patient ID: Diamond Hodges is a 48 y.o. female.  HPI    Debbra Fairy, MD, PhD [facility] [address] [address] [place], [place] [address]  Dear Dr. Dianah Fort,  I saw your patient, Diamond Hodges, upon your kind request in my neurologic clinic today for evaluation of her recurrent headaches.  The patient also has memory concerns but would like to focus on her headaches today.  As you know, Diamond Hodges is a 48 year old female with an underlying medical history of vitamin B12 deficiency, hyperlipidemia, thyroid dysfunction, allergies, anxiety, depression, chronic kidney disease, history of vertigo, kidney stone, urinary incontinence, and mild obesity, who reports recurrent headaches for the past few years.  Headaches are erratic, no obvious or telltale triggers particularly no obvious relationship to her menstrual cycle.  Her OB/GYN has given her a prescription for ibuprofen  800 mg strength and while it helps, she has noticed some stomach sensitivity from it and tries to only take half a pill.  She does not take it daily or frequently.  She may have 4 headaches per month at this time, headache frequency has become a little worse and she has woken up with a headache.  Description of the headache is not one-sided or throbbing, can be one-sided but more dull and achy and constant.  She has no associated photophobia typically, no nausea or vomiting.  She has taken Zofran  since she has been on ibuprofen  higher dose.  She had a recent eye examination with an optometrist off of [address].  She reports that her exam was benign, she did get prescription reading glasses.  She denies any sudden onset one-sided weakness or numbness or tingling or droopy face or slurring of speech.  She reports intermittent forgetfulness and difficulty retaining information.    She had seen Dr. Festus Hubert in the past for paresthesias but these have resolved.  She reports chronic neck  pain and chronic issues with her neck and popping, she was a gymnast.  She had physical therapy years ago which was helpful.  Stretching helps the headache a little bit.  She does snore per husband's feedback.  Her Epworth sleepiness score is 1 out of 24, fatigue severity score is 21 out of 63.  She has never had a sleep study but would be amenable to consider it.  Her father has sleep apnea and uses a PAP machine.  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  She works a hybrid schedule, some from home.  She tries to hydrate well, she tries to get 64 ounces of water per day.  She does not drink caffeine daily, she drinks alcohol rarely.  She has not smoked since college.   I reviewed your office note from 12/21/2023.  She had seen Dr. Festus Hubert at [facility] neurology in 2019 and Had a brain MRI in 2020.  Her brain MRI with and without contrast from 01/03/2019 showed:  IMPRESSION: Essentially normal for age MRI appearance of the brain.   In addition, I personally and independently reviewed images through the PACS system.  She had blood work through your office on 12/21/2023 and I have reviewed the results: TSH was normal at 1.22, free T4 slightly below normal at 0.8, lipid panel showed total cholesterol 190, HDL 38, LDL mildly elevated at 131, vitamin B12 on the low end of normal at 337.  For her depression, she is on sertraline .  Her Past Medical History  Is Significant For: Past Medical History:  Diagnosis Date   Allergy    seasonal   Anxiety    Chronic kidney disease    Depression    History of vertigo    Migraine with aura    Panic attack    Renal calculi    Urine incontinence    Vitamin B12 deficiency 04/30/2018    Her Past Surgical History Is Significant For: Past Surgical History:  Procedure Laterality Date   CESAREAN SECTION     CYSTOSCOPY W/ URETERAL STENT PLACEMENT Left 04/29/2015   Procedure: CYSTOSCOPY WITH RETROGRADE PYELOGRAM/LEFT URETEROSCOPY/STONE  BASKETRY/ LEFT URETERAL STENT PLACEMENT;  Surgeon: Trent Frizzle, MD;  Location: [facility];  Service: Urology;  Laterality: Left;    Her Family History Is Significant For: Family History  Problem Relation Age of Onset   Fibromyalgia Mother    Breast cancer Mother 82   Depression Mother    Hypertension Mother    Miscarriages / [place] Mother    Anxiety disorder Mother    Cancer Mother    Kidney Stones Father    Anuerysm Father        renal    Alcohol abuse Father    Kidney disease Father    Drug abuse Brother    Drug abuse Brother    Miscarriages / [place] Sister     Her Social History Is Significant For: Social History   Socioeconomic History   Marital status: Married    Spouse name: Not on file   Number of children: Not on file   Years of education: Not on file   Highest education level: Not on file  Occupational History   Not on file  Tobacco Use   Smoking status: Former    Current packs/day: 0.00    Types: Cigarettes    Quit date: 12/11/1997    Years since quitting: 26.3   Smokeless tobacco: Never   Tobacco comments:    Occasional use in college  Vaping Use   Vaping status: Never Used  Substance and Sexual Activity   Alcohol use: No   Drug use: No   Sexual activity: Yes    Partners: Male    Birth control/protection: Condom  Other Topics Concern   Not on file  Social History Narrative   Not on file   Social Drivers of Health   Financial Resource Strain: Not on file  Food Insecurity: Not on file  Transportation Needs: Not on file  Physical Activity: Not on file  Stress: Not on file  Social Connections: Not on file    Her Allergies Are:  No Known Allergies:   Her Current Medications Are:  Outpatient Encounter Medications as of 04/02/2024  Medication Sig   cyanocobalamin  ([employer] VITAMIN B12) 1000 MCG tablet Take 1 tablet (1,000 mcg total) by mouth daily.   ondansetron  (ZOFRAN -ODT) 4 MG disintegrating tablet Take 1 tablet (4 mg total) by mouth  every 8 (eight) hours as needed for nausea or vomiting.   sertraline  (ZOLOFT ) 50 MG tablet Take 1/2 a tablet a day for one week. If tolerating, then increase to one tablet a day.   naproxen  (NAPROSYN ) 500 MG tablet Take 1 tablet (500 mg total) by mouth 2 (two) times daily.   oxyCODONE  (OXY IR/ROXICODONE ) 5 MG immediate release tablet Take 1 tablet (5 mg total) by mouth every 6 (six) hours as needed for severe pain (pain score 7-10). (Patient not taking: Reported on 01/15/2024)   tamsulosin  (FLOMAX ) 0.4 MG CAPS capsule Take 1  capsule (0.4 mg total) by mouth daily.   No facility-administered encounter medications on file as of 04/02/2024.  :   Review of Systems:  Out of a complete 14 point review of systems, all are reviewed and negative with the exception of these symptoms as listed below:  Review of Systems  Neurological:        Room 9 Pt is here Alone. Pt states that for the past 6 years her headaches were on the right side. Within the past 6 months her headaches have been at the back of her head. Pt has neck pain as well. Pt states that her headaches will sometimes stay for a couple of days. Pt states that her headaches hurt more when she lays down, but when she stretches she can feel in her neck where the pain from her headache is, and the stretches help some. Pt states that sometimes when she is reading she has trouble with recall, or when she is in a meeting she will forget what the meeting was about. Pt states that sometimes she will forget what she does at home like for instance  pt forgot that she had made some brownies one night at her home and didn't remember until her family reminded her about it. ESS 1 FSS 21     Objective:  Neurological Exam  Physical Exam Physical Examination:   Vitals:   04/02/24 0825  BP: 111/74  Pulse: 74    General Examination: The patient is a very pleasant 48 y.o. female in no acute distress. She appears well-developed and well-nourished and well  groomed.   HEENT: Normocephalic, atraumatic, pupils are equal, round and reactive to light, extraocular tracking is good without limitation to gaze excursion or nystagmus noted.  No photophobia, funduscopic exam benign hearing is grossly intact to tuning fork. Face is symmetric with normal facial animation and normal facial sensation to light touch, temperature and vibration sense. Speech is clear with no dysarthria noted. There is no hypophonia. There is no lip, neck/head, jaw or voice tremor. Neck is supple with full range of passive and active motion. There are no carotid bruits on auscultation. Oropharynx exam reveals: mild mouth dryness, good dental hygiene and mild to moderate airway crowding, due to small airway.  Mallampati class II.  Tonsils on the smaller side, neck circumference 13-7/8 inches.  Mild to moderate overbite.  Tongue protrudes centrally and palate elevates symmetrically.    Chest: Clear to auscultation without wheezing, rhonchi or crackles noted.  Heart: S1+S2+0, regular and normal without murmurs, rubs or gallops noted.   Abdomen: Soft, non-tender and non-distended.  Extremities: There is no pitting edema in the distal lower extremities bilaterally.   Skin: Warm and dry without trophic changes noted.   Musculoskeletal: exam reveals no obvious joint deformities.  Slight upper body tilt to the left when standing but no obvious evidence of scoliosis.  Neurologically:  Mental status: The patient is awake, alert and oriented in all 4 spheres. Her immediate and remote memory, attention, language skills and fund of knowledge are appropriate. There is no evidence of aphasia, agnosia, apraxia or anomia. Speech is clear with normal prosody and enunciation. Thought process is linear. Mood is normal and affect is normal.  Cranial nerves II - XII are as described above under HEENT exam.  Motor exam: Normal bulk, strength and tone is noted. There is no obvious action or resting tremor.   No drift or rebound, no postural or intention tremor. Fine motor skills and coordination:  intact finger taps, hand movements and rapid alternating patting in both upper extremities, normal foot taps bilaterally in the lower extremities.  Cerebellar testing: No dysmetria or intention tremor. There is no truncal or gait ataxia.  Normal finger-to-nose, normal heel-to-shin bilaterally. Reflexes 1+ throughout, toes are downgoing bilaterally. Romberg is negative. Sensory exam: intact to light touch, temperature and vibration sense in the upper and lower extremities.  Gait, station and balance: She stands easily. No veering to one side is noted. No leaning to one side is noted. Posture is age-appropriate and stance is narrow based. Gait shows normal stride length and normal pace. No problems turning are noted.  Normal tandem walk.  Assessment and Plan:   In summary, Diamond Hodges is a very pleasant 48 y.o.-year old female 48 year old female with an underlying medical history of vitamin B12 deficiency, hyperlipidemia, thyroid dysfunction, allergies, anxiety, depression, chronic kidney disease, history of vertigo, kidney stone, urinary incontinence, and mild obesity, who presents for evaluation of her recurrent headaches of several years duration.  Headache description is not classic for migraine headaches.  She may have a combination headache including tension headaches, cervicogenic headaches, and sleep disordered breathing is not excluded as a potential contributor since she has woken up with headaches.  We talked about sleep apnea, its prognosis and treatment options as well today.  This was an extended visit of over 60 minutes with comprehensive evaluation, extended chart review, and addressing multiple issues with considerable counseling and coordination of care involved.  Her neurological exam is nonfocal and she is largely reassured.  She is up-to-date with her eye examination.  She had a benign brain MRI  in 2020 and I do not see a pressing reason to repeat it at this time but we can certainly consider it.  Headache frequency currently does not warrant a daily preventative and she agrees.  Below is a summary of my recommendations and our discussion points from today's visit.  She was given these instructions and recommendations verbally and also in writing in her after visit summary which she can access in my chart.   << Please remember, common headache triggers are: sleep deprivation, dehydration, overheating, stress, hypoglycemia or skipping meals and blood sugar fluctuations, excessive pain medications or excessive alcohol use or caffeine withdrawal. Some people have food triggers such as aged cheese, orange juice or chocolate, especially dark chocolate, or MSG (monosodium glutamate). Try to avoid these headache triggers as much possible. It may be helpful to keep a headache diary to figure out what makes your headaches worse or brings them on and what alleviates them. Some people report headache onset after exercise but studies have shown that regular exercise may actually prevent headaches from coming. If you have exercise-induced headaches, please make sure that you drink plenty of fluid before and after exercising and that you do not over do it and do not overheat. Please use ibuprofen  as needed and sparingly. Limit your caffeine to 1-2 servings/day, as caffeine can drive headaches.  I do not see a pressing reason to repeat your brain MRI; it was benign in 2020. I will order a sleep study to look for signs of obstructive sleep apnea (aka OSA). As explained, the long-term risks and ramifications of untreated moderate to severe obstructive sleep apnea may include (but are not limited to): increased risk for cardiovascular disease, including congestive heart failure, stroke, difficult to control hypertension, treatment resistant obesity, arrhythmias, especially irregular heartbeat commonly known as A. Fib.  (atrial fibrillation); even type  2 diabetes has been linked to untreated OSA.  For headache prevention, we may consider a daily preventative in the future.  Talk to your PCP about a referral to spine specialist for your chronic neck pain and about doing neck PT again.  We will plan a follow up in about 4 months. >>  Thank you very much for allowing me to participate in the care of this nice patient. If I can be of any further assistance to you please do not hesitate to call me at [phone].  Sincerely,   Debbra Fairy, MD, PhD

## 2024-04-14 ENCOUNTER — Telehealth: Payer: Self-pay | Admitting: Neurology

## 2024-04-14 NOTE — Telephone Encounter (Signed)
 NPSG & HST no auth req from [place] state. Sent mychart

## 2024-04-17 ENCOUNTER — Encounter: Payer: Self-pay | Admitting: Family Medicine

## 2024-05-19 ENCOUNTER — Encounter

## 2024-05-21 ENCOUNTER — Encounter: Payer: Self-pay | Admitting: Obstetrics and Gynecology

## 2024-05-21 MED ORDER — NORETHINDRONE 0.35 MG PO TABS: 1.0000 | ORAL_TABLET | Freq: Every day | ORAL | 0 refills | Status: DC

## 2024-05-21 NOTE — Telephone Encounter (Signed)
 Med refill request: norethindrone  0.35 mg Last AEX: 05/10/23 -JJ Next AEX: 05/28/24 -BS Last MMG (if hormonal med) 03/13/24 -BiRads 1 neg Refill authorized: Please Advise?

## 2024-05-28 ENCOUNTER — Ambulatory Visit (INDEPENDENT_AMBULATORY_CARE_PROVIDER_SITE_OTHER): Payer: 59 | Admitting: Obstetrics and Gynecology

## 2024-05-28 ENCOUNTER — Encounter: Payer: Self-pay | Admitting: Obstetrics and Gynecology

## 2024-05-28 VITALS — BP 120/84 | HR 88 | Ht 67.5 in | Wt 195.0 lb

## 2024-05-28 DIAGNOSIS — Z01419 Encounter for gynecological examination (general) (routine) without abnormal findings: Secondary | ICD-10-CM

## 2024-05-28 DIAGNOSIS — Z1331 Encounter for screening for depression: Secondary | ICD-10-CM | POA: Diagnosis not present

## 2024-05-28 DIAGNOSIS — N83201 Unspecified ovarian cyst, right side: Secondary | ICD-10-CM | POA: Diagnosis not present

## 2024-05-28 MED ORDER — NORETHINDRONE 0.35 MG PO TABS: 1.0000 | ORAL_TABLET | Freq: Every day | ORAL | 3 refills | Status: AC

## 2024-05-28 NOTE — Progress Notes (Signed)
 48 y.o. G29P2012 Married Caucasian female here for annual exam.    On progesterone only birth control pills. No periods.  Cramping stopped.  Hot natured.  Not really flashing.  Some irritability with her heavy periods, now improved.  Some brain fog.   She wonders if she is perimenopausal.  Taking Zoloft  to help with irritability, now Rx provided by a psych PA. She may wean off.   She is followed for an avascular right ovarian cyst with debris:  2.3 x 1.7 x 1.5 cm and 2 fibroids, one of which was degenerating on US  from 05/10/23.   From [place] .  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  Moved here 18 year ago.  2 daughters:  49 and 68 yo.  Has a dog.   PCP: Patient, No Pcp Per   No LMP recorded. (Menstrual status: Oral contraceptives).           Sexually active: Yes.    The current method of family planning is condoms.    Menopausal hormone therapy:  n/a Exercising: Yes.    Yoga and walking Smoker:  Former  OB History  Gravida Para Term Preterm AB Living  3 2 2  1 2   SAB IAB Ectopic Multiple Live Births  1    2    # Outcome Date GA Lbr Len/2nd Weight Sex Type Anes PTL Lv  3 Term 06/25/08   8 lb 3 oz (3.714 kg) F CS-LTranv  N LIV  2 Term 11/18/99   6 lb 3 oz (2.807 kg) F Vag-Spont  N LIV  1 SAB 12/11/96        FD     HEALTH MAINTENANCE: Last 2 paps:  02/17/21 neg HR HPV neg, 01/01/17 neg HR HPV neg History of abnormal Pap or positive HPV:  no Mammogram:   03/13/24 Breast Density Cat B, BIRADS Cat 1 neg  Colonoscopy:  Cologuard - 10/06/23 - neg  Bone Density:  n/a  Result  n/a   Immunization History  Administered Date(s) Administered   Influenza,inj,Quad PF,6+ Mos 10/03/2019, 10/13/2020, 10/19/2021   PFIZER(Purple Top)SARS-COV-2 Vaccination 03/26/2020, 04/16/2020, 03/04/2021   Tdap 05/10/2023      reports that she quit smoking about 26 years ago. Her smoking use included cigarettes. She has never used smokeless tobacco. She reports that she does not drink alcohol and does  not use drugs.  Past Medical History:  Diagnosis Date   Allergy    seasonal   Anxiety    Chronic kidney disease    Depression    History of vertigo    Migraine with aura    Panic attack    Renal calculi    Urine incontinence    Vitamin B12 deficiency 04/30/2018    Past Surgical History:  Procedure Laterality Date   CESAREAN SECTION     CYSTOSCOPY W/ URETERAL STENT PLACEMENT Left 04/29/2015   Procedure: CYSTOSCOPY WITH RETROGRADE PYELOGRAM/LEFT URETEROSCOPY/STONE BASKETRY/ LEFT URETERAL STENT PLACEMENT;  Surgeon: Trent Frizzle, MD;  Location: [facility];  Service: Urology;  Laterality: Left;    Current Outpatient Medications  Medication Sig Dispense Refill   cyanocobalamin  ([employer] VITAMIN B12) 1000 MCG tablet Take 1 tablet (1,000 mcg total) by mouth daily. 90 tablet 3   fexofenadine (ALLEGRA ALLERGY) 180 MG tablet 1 tablet Orally Once a day; Duration: 30 days     naproxen  (NAPROSYN ) 500 MG tablet Take 1 tablet (500 mg total) by mouth 2 (two) times daily. 20 tablet 0   norethindrone  (MICRONOR ) 0.35 MG tablet Take  1 tablet (0.35 mg total) by mouth daily. 28 tablet 0   ondansetron  (ZOFRAN -ODT) 4 MG disintegrating tablet Take 1 tablet (4 mg total) by mouth every 8 (eight) hours as needed for nausea or vomiting. 10 tablet 0   sertraline  (ZOLOFT ) 50 MG tablet Take 1/2 a tablet a day for one week. If tolerating, then increase to one tablet a day. 90 tablet 2   oxyCODONE  (OXY IR/ROXICODONE ) 5 MG immediate release tablet Take 1 tablet (5 mg total) by mouth every 6 (six) hours as needed for severe pain (pain score 7-10). (Patient not taking: Reported on 05/28/2024) 6 tablet 0   tamsulosin  (FLOMAX ) 0.4 MG CAPS capsule Take 1 capsule (0.4 mg total) by mouth daily. (Patient not taking: Reported on 05/28/2024) 7 capsule 0   No current facility-administered medications for this visit.    ALLERGIES: Patient has no known allergies.  Family History  Problem Relation Age of Onset   Fibromyalgia Mother     Breast cancer Mother 9   Depression Mother    Hypertension Mother    Miscarriages / [place] Mother    Anxiety disorder Mother    Cancer Mother    Kidney Stones Father    Anuerysm Father        renal    Alcohol abuse Father    Kidney disease Father    Drug abuse Brother    Drug abuse Brother    Miscarriages / [place] Sister     Review of Systems  All other systems reviewed and are negative.   PHYSICAL EXAM:  BP 120/84 (BP Location: Left Arm, Patient Position: Sitting)   Pulse 88   Ht 5' 7.5 (1.715 m)   Wt 195 lb (88.5 kg)   SpO2 97%   BMI 30.09 kg/m     General appearance: alert, cooperative and appears stated age Head: normocephalic, without obvious abnormality, atraumatic Neck: no adenopathy, supple, symmetrical, trachea midline and thyroid normal to inspection and palpation Lungs: clear to auscultation bilaterally Breasts: normal appearance, no masses or tenderness, No nipple retraction or dimpling, No nipple discharge or bleeding, No axillary adenopathy Heart: regular rate and rhythm Abdomen: soft, non-tender; no masses, no organomegaly Extremities: extremities normal, atraumatic, no cyanosis or edema Skin: skin color, texture, turgor normal. No rashes or lesions Lymph nodes: cervical, supraclavicular, and axillary nodes normal. Neurologic: grossly normal  Pelvic: External genitalia:  no lesions              No abnormal inguinal nodes palpated.              Urethra:  normal appearing urethra with no masses, tenderness or lesions              Bartholins and Skenes: normal                 Vagina: normal appearing vagina with normal color and discharge, no lesions              Cervix: no lesions              Pap taken: no Bimanual Exam:  Uterus:  normal size, contour, position, consistency, mobility, non-tender              Adnexa: no mass, fullness, tenderness              Rectal exam: yes.  Confirms.              Anus:  normal sphincter tone, no  lesions  Chaperone was  present for exam:  Cottie Diss, CMA  ASSESSMENT: Well woman visit with gynecologic exam. Uterine fibroids.  Hx degenerating fibroid. Right ovarian cyst.  Possible endometrioma versus dermoid. Dysmenorrhea, controlled on POPs. Migraine with aura.  FH breast cancer in mother, age 57.  Postmenopausal.  Uncertain if she had genetic testing.  PHQ-2: 0  PLAN: Mammogram screening discussed. Self breast awareness reviewed. Pap and HRV collected:  no.  Due in 2027. Guidelines for Calcium, Vitamin D , regular exercise program including cardiovascular and weight bearing exercise. Medication refills:  Micronor , 3 packs, 3 refills. To test hormones, patient will need to come off her birth control for 2 weeks.  She will let me know if she wishes to do this.  Return for pelvic ultrasound.  Follow up:  yearly and prn.

## 2024-05-28 NOTE — Patient Instructions (Signed)

## 2024-06-03 ENCOUNTER — Telehealth: Payer: Self-pay | Admitting: Neurology

## 2024-06-03 NOTE — Telephone Encounter (Signed)
 LVM and sent mychart msg informing pt of need to reschedule 08/05/24 appt - MD out

## 2024-06-18 ENCOUNTER — Encounter: Payer: Self-pay | Admitting: Obstetrics and Gynecology

## 2024-06-19 ENCOUNTER — Other Ambulatory Visit: Admitting: Obstetrics and Gynecology

## 2024-06-19 ENCOUNTER — Other Ambulatory Visit

## 2024-06-19 NOTE — Progress Notes (Deleted)
 GYNECOLOGY  VISIT   HPI: 48 y.o.   Married  Caucasian female   [phone] with No LMP recorded. (Menstrual status: Oral contraceptives).   here for: u/s consult      GYNECOLOGIC HISTORY: No LMP recorded. (Menstrual status: Oral contraceptives). Contraception:  Condoms  Menopausal hormone therapy:  n/a Last 2 paps:  02/17/21 neg HR HPV neg, 01/01/17 neg HPV neg  History of abnormal Pap or positive HPV:  no Mammogram:  03/13/24 Breast Density Cat B, BIRADS Cat 1 neg         OB History     Gravida  3   Para  2   Term  2   Preterm      AB  1   Living  2      SAB  1   IAB      Ectopic      Multiple      Live Births  2              Patient Active Problem List   Diagnosis Date Noted   Thrombosed external hemorrhoid 01/15/2024   Abnormal thyroid blood test 12/21/2023   Chronic nonintractable headache 12/21/2023   Mixed hyperlipidemia 12/21/2023   Generalized anxiety disorder 09/20/2022   Axillary pain, left 12/16/2021   Need for immunization against influenza 10/19/2021   Annual physical exam 10/19/2021   Family history of breast cancer 02/02/2020   Vitamin B12 deficiency 04/30/2018   Paresthesias 04/29/2018   Ureteral calculus, left 04/28/2015    Past Medical History:  Diagnosis Date   Allergy    seasonal   Anxiety    Chronic kidney disease    Depression    History of vertigo    Migraine with aura    Panic attack    Renal calculi    Urine incontinence    Vitamin B12 deficiency 04/30/2018    Past Surgical History:  Procedure Laterality Date   CESAREAN SECTION     CYSTOSCOPY W/ URETERAL STENT PLACEMENT Left 04/29/2015   Procedure: CYSTOSCOPY WITH RETROGRADE PYELOGRAM/LEFT URETEROSCOPY/STONE BASKETRY/ LEFT URETERAL STENT PLACEMENT;  Surgeon: Garnette Shack, MD;  Location: [facility];  Service: Urology;  Laterality: Left;    Current Outpatient Medications  Medication Sig Dispense Refill   cyanocobalamin  ([employer] VITAMIN B12) 1000 MCG tablet Take 1  tablet (1,000 mcg total) by mouth daily. 90 tablet 3   fexofenadine (ALLEGRA ALLERGY) 180 MG tablet 1 tablet Orally Once a day; Duration: 30 days     naproxen  (NAPROSYN ) 500 MG tablet Take 1 tablet (500 mg total) by mouth 2 (two) times daily. 20 tablet 0   norethindrone  (MICRONOR ) 0.35 MG tablet Take 1 tablet (0.35 mg total) by mouth daily. 84 tablet 3   ondansetron  (ZOFRAN -ODT) 4 MG disintegrating tablet Take 1 tablet (4 mg total) by mouth every 8 (eight) hours as needed for nausea or vomiting. 10 tablet 0   oxyCODONE  (OXY IR/ROXICODONE ) 5 MG immediate release tablet Take 1 tablet (5 mg total) by mouth every 6 (six) hours as needed for severe pain (pain score 7-10). (Patient not taking: Reported on 05/28/2024) 6 tablet 0   sertraline  (ZOLOFT ) 50 MG tablet Take 1/2 a tablet a day for one week. If tolerating, then increase to one tablet a day. 90 tablet 2   tamsulosin  (FLOMAX ) 0.4 MG CAPS capsule Take 1 capsule (0.4 mg total) by mouth daily. (Patient not taking: Reported on 05/28/2024) 7 capsule 0   No current facility-administered medications for this visit.  ALLERGIES: Patient has no known allergies.  Family History  Problem Relation Age of Onset   Fibromyalgia Mother    Breast cancer Mother 35   Depression Mother    Hypertension Mother    Miscarriages / [place] Mother    Anxiety disorder Mother    Cancer Mother    Kidney Stones Father    Anuerysm Father        renal    Alcohol abuse Father    Kidney disease Father    Drug abuse Brother    Drug abuse Brother    Miscarriages / [place] Sister     Social History   Socioeconomic History   Marital status: Married    Spouse name: Not on file   Number of children: Not on file   Years of education: Not on file   Highest education level: Not on file  Occupational History   Not on file  Tobacco Use   Smoking status: Former    Current packs/day: 0.00    Types: Cigarettes    Quit date: 12/11/1997    Years since quitting:  26.5   Smokeless tobacco: Never   Tobacco comments:    Occasional use in college  Vaping Use   Vaping status: Never Used  Substance and Sexual Activity   Alcohol use: No   Drug use: No   Sexual activity: Yes    Partners: Male    Birth control/protection: Condom  Other Topics Concern   Not on file  Social History Narrative   Not on file   Social Drivers of Health   Financial Resource Strain: Not on file  Food Insecurity: Not on file  Transportation Needs: Not on file  Physical Activity: Not on file  Stress: Not on file  Social Connections: Not on file  Intimate Partner Violence: Not on file    Review of Systems  PHYSICAL EXAMINATION:   There were no vitals taken for this visit.    General appearance: alert, cooperative and appears stated age Head: Normocephalic, without obvious abnormality, atraumatic Neck: no adenopathy, supple, symmetrical, trachea midline and thyroid normal to inspection and palpation Lungs: clear to auscultation bilaterally Breasts: normal appearance, no masses or tenderness, No nipple retraction or dimpling, No nipple discharge or bleeding, No axillary or supraclavicular adenopathy Heart: regular rate and rhythm Abdomen: soft, non-tender, no masses,  no organomegaly Extremities: extremities normal, atraumatic, no cyanosis or edema Skin: Skin color, texture, turgor normal. No rashes or lesions Lymph nodes: Cervical, supraclavicular, and axillary nodes normal. No abnormal inguinal nodes palpated Neurologic: Grossly normal  Pelvic: External genitalia:  no lesions              Urethra:  normal appearing urethra with no masses, tenderness or lesions              Bartholins and Skenes: normal                 Vagina: normal appearing vagina with normal color and discharge, no lesions              Cervix: no lesions                Bimanual Exam:  Uterus:  normal size, contour, position, consistency, mobility, non-tender              Adnexa: no mass,  fullness, tenderness              Rectal exam: {yes no:314532}.  Confirms.  Anus:  normal sphincter tone, no lesions  Chaperone was present for exam:  {BSCHAPERONE:31226::Emily F, CMA}  ASSESSMENT:    PLAN:    {LABS (Optional):23779}  ***  total time was spent for this patient encounter, including preparation, face-to-face counseling with the patient, coordination of care, and documentation of the encounter.

## 2024-08-05 ENCOUNTER — Ambulatory Visit: Admitting: Neurology

## 2024-08-26 ENCOUNTER — Encounter: Payer: Self-pay | Admitting: Medical-Surgical

## 2024-08-26 ENCOUNTER — Ambulatory Visit: Admitting: Medical-Surgical

## 2024-08-26 VITALS — BP 116/72 | HR 84 | Resp 20 | Ht 67.5 in | Wt 197.0 lb

## 2024-08-26 DIAGNOSIS — K645 Perianal venous thrombosis: Secondary | ICD-10-CM

## 2024-08-26 MED ORDER — NITROGLYCERIN 0.4 % RE OINT: TOPICAL_OINTMENT | RECTAL | 2 refills | Status: AC

## 2024-08-26 NOTE — Progress Notes (Unsigned)
        Established patient visit   History of Present Illness   Discussed the use of AI scribe software for clinical note transcription with the patient, who gave verbal consent to proceed.  History of Present Illness   Diamond Hodges is a 48 year old female with a history of hemorrhoids who presents with recurrent hemorrhoids.  Perianal discomfort and hemorrhoidal symptoms - Recurrent hemorrhoid for approximately ten days - Sensitivity present without bleeding - Current episode is less painful than previous episode in February, which involved bleeding - No purulent drainage - No significant changes in the hemorrhoid's condition  Bowel habits and contributing factors - Occasional straining during bowel movements - Increased bowel movements after initial use of suppositories up to four times daily, leading to reduction to twice daily - Increased fiber and water intake - Increased physical activity  Symptom management and response to treatment - Suppositories used up to four times daily, reduced to twice daily - Generic cream used for three to four days without significant improvement - Witch hazel application, sitz baths, and ice used with no significant change in symptoms       Physical Exam   Physical Exam Vitals reviewed.  Constitutional:      General: She is not in acute distress.    Appearance: Normal appearance.  HENT:     Head: Normocephalic and atraumatic.  Cardiovascular:     Rate and Rhythm: Normal rate and regular rhythm.     Heart sounds:     No friction rub.  Pulmonary:     Effort: Pulmonary effort is normal. No respiratory distress.  Skin:    General: Skin is warm and dry.  Neurological:     Mental Status: She is alert and oriented to person, place, and time.  Psychiatric:        Mood and Affect: Mood normal.        Behavior: Behavior normal.        Thought Content: Thought content normal.        Judgment: Judgment normal.    Assessment & Plan    Assessment and Plan    Thrombosed external hemorrhoid Recurrent thrombosed external hemorrhoid for ten days. Surgical intervention not recommended due to infection risk beyond 72 hours. - Prescribed nitroglycerin  ointment 0.5% topically every 12 hours with a gloved finger. - Continue witch hazel and warm sitz baths. - Monitor for infection signs such as purulent drainage. - GI referral placed to discuss options for definitive treatment.   Constipation Intermittent constipation with occasional straining. Discussed lifestyle modifications to prevent recurrence. - Continue dietary and lifestyle modifications including increased fiber, hydration, and physical activity. - Avoid prolonged sitting and straining during bowel movements.     Follow up   Return if symptoms worsen or fail to improve. __________________________________ Diamond FREDRIK Palin, DNP, APRN, FNP-BC Primary Care and Sports Medicine [facility]

## 2024-10-06 ENCOUNTER — Ambulatory Visit: Payer: Self-pay

## 2024-10-06 ENCOUNTER — Ambulatory Visit: Admitting: Family Medicine

## 2024-10-06 VITALS — BP 104/69 | HR 88 | Ht 67.5 in | Wt 198.0 lb

## 2024-10-06 DIAGNOSIS — R1906 Epigastric swelling, mass or lump: Secondary | ICD-10-CM | POA: Insufficient documentation

## 2024-10-06 DIAGNOSIS — R110 Nausea: Secondary | ICD-10-CM | POA: Diagnosis not present

## 2024-10-06 DIAGNOSIS — N201 Calculus of ureter: Secondary | ICD-10-CM | POA: Insufficient documentation

## 2024-10-06 DIAGNOSIS — J309 Allergic rhinitis, unspecified: Secondary | ICD-10-CM | POA: Insufficient documentation

## 2024-10-06 MED ORDER — ONDANSETRON 4 MG PO TBDP: 4.0000 mg | ORAL_TABLET | Freq: Three times a day (TID) | ORAL | 0 refills | Status: AC | PRN

## 2024-10-06 NOTE — Telephone Encounter (Signed)
 FYI Only or Action Required?: FYI only for provider.  Patient was last seen in primary care on 08/26/2024 by Willo Mini, NP.  Called Nurse Triage reporting Abdominal Pain.  Symptoms began about a month ago.  Interventions attempted: Nothing.  Symptoms are: gradually worsening.  Triage Disposition: See Physician Within 24 Hours  Patient/caregiver understands and will follow disposition?: Yes    Copied from CRM #[id number]. Topic: Clinical - Red Word Triage >> Oct 06, 2024  8:05 AM Antwanette L wrote: Red Word that prompted transfer to Nurse Triage: The patient reports experiencing persistent stomach and back pain accompanied by nausea for the past month. Reason for Disposition  [1] MODERATE pain (e.g., interferes with normal activities) AND [2] pain comes and goes (cramps) AND [3] present > 24 hours  (Exception: Pain with Vomiting or Diarrhea - see that Guideline.)  Answer Assessment - Initial Assessment Questions 1. LOCATION: Where does it hurt?      All over stomach  2. RADIATION: Does the pain shoot anywhere else? (e.g., chest, back)     Back pain  3. ONSET: When did the pain begin? (e.g., minutes, hours or days ago)      3 weeks ago had diarrhea that is now resolved  4. SUDDEN: Gradual or sudden onset?     Gradual  5. PATTERN Does the pain come and go, or is it constant?     Comes and goes  6. SEVERITY: How bad is the pain?  (e.g., Scale 1-10; mild, moderate, or severe)     4/10  7. RECURRENT SYMPTOM: Have you ever had this type of stomach pain before? If Yes, ask: When was the last time? and What happened that time?      Yes, has had gas/ constipation issues in the past  8. CAUSE: What do you think is causing the stomach pain? (e.g., gallstones, recent abdominal surgery)     Kidney stone  9. RELIEVING/AGGRAVATING FACTORS: What makes it better or worse? (e.g., antacids, bending or twisting motion, bowel movement)     Releasing gas  10. OTHER SYMPTOMS:  Do you have any other symptoms? (e.g., back pain, diarrhea, fever, urination pain, vomiting)       Nausea, hemorrhoid, fatigue  Protocols used: Abdominal Pain - Female-A-AH

## 2024-10-06 NOTE — Progress Notes (Signed)
 Diamond Hodges - 48 y.o. female MRN [id number]  Date of birth: [birth year]  Subjective Chief Complaint  Patient presents with   Abdominal Pain   Nausea    HPI Diamond Hodges is a 48 year old female here today with complaint of abdominal pain and nausea.  Had upper respiratory symptoms a several weeks ago.  Had some residual postnasal drip developed some loose stools shortly afterwards but this did resolve on its own.  Now having some epigastric fullness with intermittent discomfort along with some nausea.  She did have a 6 mm ureteral stone earlier this year with moderate hydronephrosis.  Prescribed Flomax  but unclear if she ever passed this.  She does feel like she has intermittent pain in the flank area at times still.  She denies new urinary symptoms.  She did not see urology.  ROS:  A comprehensive ROS was completed and negative except as noted per HPI  No Known Allergies  Past Medical History:  Diagnosis Date   Allergy    seasonal   Anxiety    Chronic kidney disease    Depression    History of vertigo    Migraine with aura    Panic attack    Renal calculi    Urine incontinence    Vitamin B12 deficiency 04/30/2018    Past Surgical History:  Procedure Laterality Date   CESAREAN SECTION     CYSTOSCOPY W/ URETERAL STENT PLACEMENT Left 04/29/2015   Procedure: CYSTOSCOPY WITH RETROGRADE PYELOGRAM/LEFT URETEROSCOPY/STONE BASKETRY/ LEFT URETERAL STENT PLACEMENT;  Surgeon: Garnette Shack, MD;  Location: [facility];  Service: Urology;  Laterality: Left;    Social History   Socioeconomic History   Marital status: Married    Spouse name: Not on file   Number of children: Not on file   Years of education: Not on file   Highest education level: Bachelor's degree (e.g., BA, AB, BS)  Occupational History   Not on file  Tobacco Use   Smoking status: Former    Current packs/day: 0.00    Types: Cigarettes    Quit date: 12/11/1997    Years since quitting: 26.8   Smokeless  tobacco: Never   Tobacco comments:    Occasional use in college  Vaping Use   Vaping status: Never Used  Substance and Sexual Activity   Alcohol use: No   Drug use: No   Sexual activity: Yes    Partners: Male    Birth control/protection: Condom  Other Topics Concern   Not on file  Social History Narrative   Not on file   Social Drivers of Health   Financial Resource Strain: Low Risk  (10/06/2024)   Overall Financial Resource Strain (CARDIA)    Difficulty of Paying Living Expenses: Not hard at all  Food Insecurity: No Food Insecurity (10/06/2024)   Hunger Vital Sign    Worried About Running Out of Food in the Last Year: Never true    Ran Out of Food in the Last Year: Never true  Transportation Needs: No Transportation Needs (10/06/2024)   PRAPARE - Administrator, Civil Service (Medical): No    Lack of Transportation (Non-Medical): No  Physical Activity: Sufficiently Active (10/06/2024)   Exercise Vital Sign    Days of Exercise per Week: 4 days    Minutes of Exercise per Session: 40 min  Recent Concern: Physical Activity - Insufficiently Active (08/25/2024)   Exercise Vital Sign    Days of Exercise per Week: 2 days  Minutes of Exercise per Session: 50 min  Stress: No Stress Concern Present (10/06/2024)   [employer] of Occupational Health - Occupational Stress Questionnaire    Feeling of Stress: Not at all  Social Connections: Socially Integrated (10/06/2024)   Social Connection and Isolation Panel    Frequency of Communication with Friends and Family: More than three times a week    Frequency of Social Gatherings with Friends and Family: Once a week    Attends Religious Services: 1 to 4 times per year    Active Member of Clubs or Organizations: Yes    Attends Engineer, Structural: More than 4 times per year    Marital Status: Married    Family History  Problem Relation Age of Onset   Fibromyalgia Mother    Breast cancer Mother 74    Depression Mother    Hypertension Mother    Miscarriages / Stillbirths Mother    Anxiety disorder Mother    Cancer Mother    Kidney Stones Father    Anuerysm Father        renal    Alcohol abuse Father    Kidney disease Father    Drug abuse Brother    Drug abuse Brother    Miscarriages / Stillbirths Sister     Health Maintenance  Topic Date Due   Hepatitis B Vaccines 19-59 Average Risk (1 of 3 - 19+ 3-dose series) Never done   Colonoscopy  Never done   Influenza Vaccine  03/10/2025 (Originally 07/11/2024)   COVID-19 Vaccine (4 - 2025-26 season) 10/22/2025 (Originally 08/11/2024)   Mammogram  03/13/2025   Cervical Cancer Screening (HPV/Pap Cotest)  02/17/2026   DTaP/Tdap/Td (2 - Td or Tdap) 05/09/2033   Hepatitis C Screening  Completed   HIV Screening  Completed   Pneumococcal Vaccine  Aged Out   HPV VACCINES  Aged Out   Meningococcal B Vaccine  Aged Out     ----------------------------------------------------------------------------------------------------------------------------------------------------------------------------------------------------------------- Physical Exam BP 104/69 (BP Location: Left Arm, Patient Position: Sitting, Cuff Size: Normal)   Pulse 88   Ht 5' 7.5 (1.715 m)   Wt 198 lb (89.8 kg)   SpO2 99%   BMI 30.55 kg/m   Physical Exam Constitutional:      Appearance: She is well-developed.  Eyes:     General: No scleral icterus. Cardiovascular:     Rate and Rhythm: Normal rate and regular rhythm.  Pulmonary:     Effort: Pulmonary effort is normal.     Breath sounds: Normal breath sounds.  Abdominal:     General: Abdomen is flat. There is no distension.     Palpations: Abdomen is soft.     Tenderness: There is abdominal tenderness (Epigastric).  Musculoskeletal:     Cervical back: Neck supple.  Neurological:     Mental Status: She is alert.  Psychiatric:        Mood and Affect: Mood normal.        Behavior: Behavior normal.      ------------------------------------------------------------------------------------------------------------------------------------------------------------------------------------------------------------------- Assessment and Plan  Right ureteral calculus Still feel that she has intermittent pain related to this.  Does not like she had any further treatment after ED visit.  Updating renal function.  Recheck urinalysis.  Epigastric fullness Tenderness palpation along the epigastric area.  Negative Murphy sign no CVA tenderness.  Checking labs will order abdominal ultrasound.  Zofran  as needed for nausea.   Meds ordered this encounter  Medications   ondansetron  (ZOFRAN -ODT) 4 MG disintegrating tablet    Sig:  Take 1 tablet (4 mg total) by mouth every 8 (eight) hours as needed for nausea or vomiting.    Dispense:  20 tablet    Refill:  0    No follow-ups on file.

## 2024-10-06 NOTE — Assessment & Plan Note (Signed)
 Still feel that she has intermittent pain related to this.  Does not like she had any further treatment after ED visit.  Updating renal function.  Recheck urinalysis.

## 2024-10-06 NOTE — Assessment & Plan Note (Signed)
 Tenderness palpation along the epigastric area.  Negative Murphy sign no CVA tenderness.  Checking labs will order abdominal ultrasound.  Zofran  as needed for nausea.

## 2024-10-06 NOTE — Telephone Encounter (Signed)
 Patient scheduled.

## 2024-10-08 LAB — CBC WITH DIFFERENTIAL/PLATELET
Basophils Absolute: 0.1 x10E3/uL (ref 0.0–0.2)
Basos: 1 %
EOS (ABSOLUTE): 0.2 x10E3/uL (ref 0.0–0.4)
Eos: 2 %
Hematocrit: 41.1 % (ref 34.0–46.6)
Hemoglobin: 13.3 g/dL (ref 11.1–15.9)
Immature Grans (Abs): 0 x10E3/uL (ref 0.0–0.1)
Immature Granulocytes: 0 %
Lymphocytes Absolute: 2.2 x10E3/uL (ref 0.7–3.1)
Lymphs: 19 %
MCH: 29 pg (ref 26.6–33.0)
MCHC: 32.4 g/dL (ref 31.5–35.7)
MCV: 90 fL (ref 79–97)
Monocytes Absolute: 0.7 x10E3/uL (ref 0.1–0.9)
Monocytes: 6 %
Neutrophils Absolute: 8.1 x10E3/uL — ABNORMAL HIGH (ref 1.4–7.0)
Neutrophils: 72 %
Platelets: 264 x10E3/uL (ref 150–450)
RBC: 4.59 x10E6/uL (ref 3.77–5.28)
RDW: 13 % (ref 11.7–15.4)
WBC: 11.3 x10E3/uL — ABNORMAL HIGH (ref 3.4–10.8)

## 2024-10-08 LAB — MICROSCOPIC EXAMINATION
Bacteria, UA: NONE SEEN
Casts: NONE SEEN /LPF
RBC, Urine: NONE SEEN /HPF (ref 0–2)

## 2024-10-08 LAB — CMP14+EGFR
ALT: 14 IU/L (ref 0–32)
AST: 15 IU/L (ref 0–40)
Albumin: 4.5 g/dL (ref 3.9–4.9)
Alkaline Phosphatase: 87 IU/L (ref 41–116)
BUN/Creatinine Ratio: 16 (ref 9–23)
BUN: 14 mg/dL (ref 6–24)
Bilirubin Total: 0.2 mg/dL (ref 0.0–1.2)
CO2: 23 mmol/L (ref 20–29)
Calcium: 9.9 mg/dL (ref 8.7–10.2)
Chloride: 102 mmol/L (ref 96–106)
Creatinine, Ser: 0.85 mg/dL (ref 0.57–1.00)
Globulin, Total: 2.6 g/dL (ref 1.5–4.5)
Glucose: 102 mg/dL — ABNORMAL HIGH (ref 70–99)
Potassium: 4.5 mmol/L (ref 3.5–5.2)
Sodium: 139 mmol/L (ref 134–144)
Total Protein: 7.1 g/dL (ref 6.0–8.5)
eGFR: 85 mL/min/1.73 (ref >59)

## 2024-10-08 LAB — URINE CULTURE

## 2024-10-08 LAB — URINALYSIS, ROUTINE W REFLEX MICROSCOPIC
Bilirubin, UA: NEGATIVE
Glucose, UA: NEGATIVE
Ketones, UA: NEGATIVE
Leukocytes,UA: NEGATIVE
Nitrite, UA: NEGATIVE
Protein,UA: NEGATIVE
Specific Gravity, UA: 1.011 (ref 1.005–1.030)
Urobilinogen, Ur: 0.2 mg/dL (ref 0.2–1.0)
pH, UA: 5.5 (ref 5.0–7.5)

## 2024-10-08 LAB — LIPASE: Lipase: 20 U/L (ref 14–72)

## 2024-10-14 ENCOUNTER — Ambulatory Visit

## 2024-10-14 DIAGNOSIS — R1906 Epigastric swelling, mass or lump: Secondary | ICD-10-CM

## 2024-10-14 DIAGNOSIS — R110 Nausea: Secondary | ICD-10-CM

## 2024-10-17 ENCOUNTER — Ambulatory Visit: Payer: Self-pay | Admitting: Family Medicine
